# Patient Record
Sex: Female | Born: 1975 | Race: White | Hispanic: No | Smoking: Current every day smoker
Health system: Southern US, Community
[De-identification: ages and names within clinical notes are randomized; demographics above are authoritative.]

## PROBLEM LIST (undated history)

## (undated) HISTORY — PX: MANDIBLE SURGERY: SHX707

## (undated) HISTORY — PX: TUBAL LIGATION: SHX77

---

## 1998-08-08 ENCOUNTER — Other Ambulatory Visit: Admission: RE | Admit: 1998-08-08 | Discharge: 1998-08-08 | Payer: Self-pay | Admitting: Obstetrics & Gynecology

## 1999-03-05 ENCOUNTER — Emergency Department (HOSPITAL_COMMUNITY): Admission: EM | Admit: 1999-03-05 | Discharge: 1999-03-06 | Payer: Self-pay | Admitting: Emergency Medicine

## 1999-03-06 ENCOUNTER — Encounter: Payer: Self-pay | Admitting: Emergency Medicine

## 1999-08-14 ENCOUNTER — Encounter: Payer: Self-pay | Admitting: Emergency Medicine

## 1999-08-14 ENCOUNTER — Emergency Department (HOSPITAL_COMMUNITY): Admission: EM | Admit: 1999-08-14 | Discharge: 1999-08-14 | Payer: Self-pay | Admitting: Emergency Medicine

## 2000-05-28 ENCOUNTER — Other Ambulatory Visit: Admission: RE | Admit: 2000-05-28 | Discharge: 2000-05-28 | Payer: Self-pay | Admitting: Obstetrics and Gynecology

## 2000-05-31 ENCOUNTER — Inpatient Hospital Stay (HOSPITAL_COMMUNITY): Admission: AD | Admit: 2000-05-31 | Discharge: 2000-05-31 | Payer: Self-pay | Admitting: Obstetrics and Gynecology

## 2000-05-31 ENCOUNTER — Encounter: Payer: Self-pay | Admitting: Obstetrics and Gynecology

## 2000-08-27 ENCOUNTER — Encounter: Payer: Self-pay | Admitting: Obstetrics and Gynecology

## 2000-08-27 ENCOUNTER — Ambulatory Visit (HOSPITAL_COMMUNITY): Admission: RE | Admit: 2000-08-27 | Discharge: 2000-08-27 | Payer: Self-pay | Admitting: Obstetrics and Gynecology

## 2000-11-26 ENCOUNTER — Inpatient Hospital Stay (HOSPITAL_COMMUNITY): Admission: AD | Admit: 2000-11-26 | Discharge: 2000-11-26 | Payer: Self-pay | Admitting: *Deleted

## 2001-01-18 ENCOUNTER — Encounter (INDEPENDENT_AMBULATORY_CARE_PROVIDER_SITE_OTHER): Payer: Self-pay

## 2001-01-18 ENCOUNTER — Inpatient Hospital Stay (HOSPITAL_COMMUNITY): Admission: AD | Admit: 2001-01-18 | Discharge: 2001-01-20 | Payer: Self-pay | Admitting: Obstetrics and Gynecology

## 2001-03-09 ENCOUNTER — Other Ambulatory Visit: Admission: RE | Admit: 2001-03-09 | Discharge: 2001-03-09 | Payer: Self-pay | Admitting: Obstetrics and Gynecology

## 2001-03-21 ENCOUNTER — Emergency Department (HOSPITAL_COMMUNITY): Admission: EM | Admit: 2001-03-21 | Discharge: 2001-03-22 | Payer: Self-pay | Admitting: Emergency Medicine

## 2001-12-11 ENCOUNTER — Ambulatory Visit (HOSPITAL_COMMUNITY): Admission: RE | Admit: 2001-12-11 | Discharge: 2001-12-11 | Payer: Self-pay | Admitting: Obstetrics and Gynecology

## 2002-01-16 ENCOUNTER — Emergency Department (HOSPITAL_COMMUNITY): Admission: EM | Admit: 2002-01-16 | Discharge: 2002-01-16 | Payer: Self-pay | Admitting: Emergency Medicine

## 2002-02-15 ENCOUNTER — Emergency Department (HOSPITAL_COMMUNITY): Admission: EM | Admit: 2002-02-15 | Discharge: 2002-02-15 | Payer: Self-pay | Admitting: Emergency Medicine

## 2002-02-15 ENCOUNTER — Encounter: Payer: Self-pay | Admitting: Emergency Medicine

## 2002-03-28 ENCOUNTER — Emergency Department (HOSPITAL_COMMUNITY): Admission: EM | Admit: 2002-03-28 | Discharge: 2002-03-28 | Payer: Self-pay | Admitting: Emergency Medicine

## 2002-03-28 ENCOUNTER — Encounter: Payer: Self-pay | Admitting: Emergency Medicine

## 2002-04-20 ENCOUNTER — Emergency Department (HOSPITAL_COMMUNITY): Admission: EM | Admit: 2002-04-20 | Discharge: 2002-04-20 | Payer: Self-pay | Admitting: Emergency Medicine

## 2002-06-12 ENCOUNTER — Inpatient Hospital Stay (HOSPITAL_COMMUNITY): Admission: AD | Admit: 2002-06-12 | Discharge: 2002-06-17 | Payer: Self-pay | Admitting: Oral Surgery

## 2002-06-13 ENCOUNTER — Encounter: Payer: Self-pay | Admitting: Oral Surgery

## 2002-08-16 ENCOUNTER — Emergency Department (HOSPITAL_COMMUNITY): Admission: EM | Admit: 2002-08-16 | Discharge: 2002-08-16 | Payer: Self-pay | Admitting: Emergency Medicine

## 2002-12-28 ENCOUNTER — Other Ambulatory Visit: Admission: RE | Admit: 2002-12-28 | Discharge: 2002-12-28 | Payer: Self-pay | Admitting: Obstetrics and Gynecology

## 2003-07-24 ENCOUNTER — Encounter: Payer: Self-pay | Admitting: Orthodontics and Dentofacial Orthopedics

## 2003-07-24 ENCOUNTER — Ambulatory Visit (HOSPITAL_COMMUNITY)
Admission: RE | Admit: 2003-07-24 | Discharge: 2003-07-24 | Payer: Self-pay | Admitting: Orthodontics and Dentofacial Orthopedics

## 2003-09-03 ENCOUNTER — Encounter: Payer: Self-pay | Admitting: Emergency Medicine

## 2003-09-03 ENCOUNTER — Emergency Department (HOSPITAL_COMMUNITY): Admission: EM | Admit: 2003-09-03 | Discharge: 2003-09-03 | Payer: Self-pay | Admitting: Emergency Medicine

## 2005-09-26 ENCOUNTER — Emergency Department (HOSPITAL_COMMUNITY): Admission: EM | Admit: 2005-09-26 | Discharge: 2005-09-26 | Payer: Self-pay | Admitting: Emergency Medicine

## 2007-08-02 ENCOUNTER — Emergency Department (HOSPITAL_COMMUNITY): Admission: EM | Admit: 2007-08-02 | Discharge: 2007-08-02 | Payer: Self-pay | Admitting: Family Medicine

## 2007-08-27 ENCOUNTER — Emergency Department (HOSPITAL_COMMUNITY): Admission: EM | Admit: 2007-08-27 | Discharge: 2007-08-27 | Payer: Self-pay | Admitting: Emergency Medicine

## 2009-08-11 ENCOUNTER — Encounter: Admission: RE | Admit: 2009-08-11 | Discharge: 2009-08-11 | Payer: Self-pay | Admitting: Infectious Diseases

## 2010-09-01 ENCOUNTER — Emergency Department (HOSPITAL_COMMUNITY): Admission: EM | Admit: 2010-09-01 | Discharge: 2010-09-01 | Payer: Self-pay | Admitting: Emergency Medicine

## 2011-01-23 LAB — PROTIME-INR: Prothrombin Time: 13.1 seconds (ref 11.6–15.2)

## 2011-01-23 LAB — CBC
HCT: 41.8 % (ref 36.0–46.0)
MCHC: 34 g/dL (ref 30.0–36.0)
Platelets: 230 10*3/uL (ref 150–400)
RDW: 13.6 % (ref 11.5–15.5)

## 2011-01-23 LAB — COMPREHENSIVE METABOLIC PANEL
Albumin: 3.9 g/dL (ref 3.5–5.2)
BUN: 9 mg/dL (ref 6–23)
Calcium: 9.2 mg/dL (ref 8.4–10.5)
Chloride: 105 mEq/L (ref 96–112)
Creatinine, Ser: 0.71 mg/dL (ref 0.4–1.2)
GFR calc non Af Amer: >60 mL/min (ref >60)
Total Bilirubin: 0.5 mg/dL (ref 0.3–1.2)

## 2011-01-23 LAB — DIFFERENTIAL
Basophils Absolute: 0 10*3/uL (ref 0.0–0.1)
Basophils Relative: 1 % (ref 0–1)
Monocytes Absolute: 0.8 10*3/uL (ref 0.1–1.0)
Neutro Abs: 4.6 10*3/uL (ref 1.7–7.7)
Neutrophils Relative %: 56 % (ref 43–77)

## 2011-01-23 LAB — APTT: aPTT: 33 seconds (ref 24–37)

## 2011-03-29 NOTE — Discharge Summary (Signed)
[facility]  Patient:    Meagan Ramos, Meagan Ramos                     MRN: [id number] Adm. Date:  [id number] Disc. Date: [id number] Attending:  Malon Kindle                           Discharge Summary                                A 35 year old white female para 1-0-1-1, gravida 3.  Last period April 20, 2000.  [facility] February 04, 2001 by dates and January 24, 2001 by 18 week ultrasound.  Admitted in active labor.  Blood group and type O+ with a negative antibody.  VDRL nonreactive.  Rubella positive.  Hepatitis B surface antigen negative.  HIV nonreactive.  GC and chlamydia negative. Triple screen normal.  One hour glucola was not available but the patient reports group B strep was positive.  Patient had first trimester bleeding and smoked.  She was advised to quit smoking.  On January 09, 2001 the cervix was 2 cm, 50%.  She began contracting on the day of admission and came here for evaluation.  Otherwise, her prenatal course was unremarkable.  ALLERGIES:                    PENICILLIN.  PAST MEDICAL HISTORY:         Usual childhood diseases.  PAST SURGICAL HISTORY:        Jaw surgery in 1994.  FAMILY HISTORY:               Mother with thyroid cancer.  Fathers health is unknown.  SOCIAL HISTORY:               Alcohol, tobacco, and drugs negative except for five cigarettes a day.  PAST OBSTETRICAL HISTORY:     In 1994 therapeutic abortion, August 1998 7 pound 10 ounce female vaginally, precipitous delivery by the nurse.  PHYSICAL EXAMINATION  VITAL SIGNS:                  Blood pressure 124/75, pulse 96, temperature 97.3.  ABDOMEN:                      Soft.  Fundal height is 40 cm on January 09, 2001. Fetal heart tones showed occasional decelerations with good accelerations, contractions q.4-5 minutes.  PELVIC:                       Cervix 3-4 cm, 70-80% effaced, vertex -2. Artificial rupture of membranes produced clear fluid.  ADMITTING IMPRESSION:          Intrauterine pregnancy at 39 weeks, positive group B strep.  The patient was placed on intravenous clindamycin.  By 6:50 p.m. the contractions were not terribly painful.  Cervix was 3-4 cm, 80-90%, vertex at a -2.  Cervix was still somewhat posterior.  Pitocin was begun.  The patient progressed to full dilatation and delivered spontaneously LOA over a second degree perineal laceration by Dr. Ambrose Mantle.  A living female infant 8 pounds 9 ounces Apgars of 9 at one and 9 at five minutes.  The placenta was intact.  Uterus was normal.  Rectal examination was negative.  Second degree perineal laceration repaired with 3-0  Dexon under local block.  Blood loss about 400 cc.  Postpartum the patient did well.  She did complain of some pain in the right inguinal area on the first postpartum day.  This resolved.  She had no further complaints.  She was considered a candidate for discharge on the second postpartum day.  LABORATORIES:                 White count 15,300, hemoglobin 12.7, hematocrit 37.2, platelet count 262,000.  Followup hemoglobin 11.5, hematocrit 33.7.  RPR was nonreactive.  FINAL DIAGNOSES:              Intrauterine pregnancy 39 weeks, delivered left occiput anterior, positive group B strep.  OPERATION:                    Spontaneous delivery left occiput anterior, repair of second degree perineal laceration.  CONDITION ON DISCHARGE:       Improved.  DISCHARGE INSTRUCTIONS:       Regular discharge instruction booklet.  Patient is advised to return to the office in six weeks for followup examination.  She is given a prescription for 16 Percocet 325/325 tablets to take q.4-6h. p.r.n. pain. DD:  01/20/01 TD:  01/20/01 Job: [id number]

## 2011-03-29 NOTE — Op Note (Signed)
**Note De-Identified Meagan Ramos Obfuscation** NAME:  Meagan Ramos, Meagan Ramos                        ACCOUNT NO.:  [id number]   MEDICAL RECORD NO.:  [id number]                   PATIENT TYPE:  INP   LOCATION:  5743                                 FACILITY:  [facility]   PHYSICIAN:  Georgia Lopes, M.D.               DATE OF BIRTH:  Jun 16, 1976   DATE OF PROCEDURE:  06/15/2002  DATE OF DISCHARGE:  06/17/2002                                 OPERATIVE REPORT   PREOPERATIVE DIAGNOSIS:  Left temporomandibular joint closed lock.   POSTOPERATIVE DIAGNOSIS:  Left temporomandibular joint closed lock.   PROCEDURE:  Left temporomandibular joint arthrotomy with meniscal plication  and arthroplasty.   SURGEON:  Georgia Lopes, M.D.   ANESTHESIA:  General, Burna Forts, M.D., attending.   ESTIMATED BLOOD LOSS:  Minimum.   COMPLICATIONS:  None.   SPECIMENS:  Aerobic and anaerobic cultures, left TMJ.   DESCRIPTION OF PROCEDURE:  The patient was taken to the operating room and  placed on the operating table in the supine position.  General anesthesia  was administered intravenously and a nasoendotracheal tube was placed and  secured.  The eyes were lubricated and protected.  Then the patient was  prepped and draped.  A sterile marking pen was used to demarcate the  endaural incision line.  Then 2% lidocaine and 1:100,000 epinephrine was  infiltrated x7 cc and Marcaine 0.5% and 1:200,000 epinephrine was  infiltrated, a total of 4 cc.  Then a #15 blade was used to make the  endaural incision.  The dissection was carried down through the skin and  into the subcutaneous tissue using a hemostat.  Then the superior aspect of  the incision was dissected first down to the deep layer of temporal fascia.  This was used as a plane of dissection and a hemostat was used to tunnel  underneath the deep tissue and exit through the various points of the  incision and to elevate the tissue down to this level.  Bovie electrocautery  and bipolar was used.  The  dissection was carried down anteriorly at this  plane to the zygomatic arch, then a horizontal incision was made over the  zygomatic arch.  Dissection was carried down over the joint space, and then  the joint was incised with a #15 blade.  When the joint was incised, a  purulent-appearing exudate was expressed.  Aerobic anaerobic cultures were  taken of this material.  Then a clamp was placed on the inferior border of  the mandible to aid in retraction of the jaw and the joint was examined.  It  appeared that the disc was in normal configuration in closed mouth.  However, when the mouth was opened the disc did displace anteriorly.  There  was a large TMJ articular eminence present, and a decision was made  therefore to trim this eminence to help with disc translation.  The rasp was  used in  the handpiece under irrigation to smooth the articular eminence.  This was the arthroplasty.  Then the disc was freed anteriorly with the  [facility] elevator and a 4-0 Mersilene was used to make a plicating suture in  the retrodisc tissues x3 sutures.  The wound was then irrigated copiously  and then the capsule was closed with 3-0  Mersilene, deep tissues were closed with 3-0 Mersilene and 3-0 chromic, and  the skin was closed with 5-0 nylon and 5-0 Monocryl.  A bacitracin dressing  was placed, then Kerlix and fluffs and an Ace bandage were applied.  The  patient was awakened, extubated, taken to the recovery room breathing  spontaneously, in good condition.                                               Georgia Lopes, M.D.    SMJ/MEDQ  D:  06/15/2002  T:  06/18/2002  Job:  [id number]

## 2011-03-29 NOTE — H&P (Signed)
NAME:  Meagan Ramos, Meagan Ramos                        ACCOUNT NO.:  [id number]   MEDICAL RECORD NO.:  [id number]                   PATIENT TYPE:  INP   LOCATION:  5743                                 FACILITY:  [facility]   PHYSICIAN:  Georgia Lopes, M.D.               DATE OF BIRTH:  1976-11-03   DATE OF ADMISSION:  06/12/2002  DATE OF DISCHARGE:  06/17/2002                                HISTORY & PHYSICAL   HISTORY OF PRESENT ILLNESS:  Hania is a 34 year old white female who is  admitted for left TMJ jaw joint dislocation.  She was a patient in my office  who was seen on 07/31 for arthrocentesis and steroid injection of the left  TMJ.  Using intravenous sedation the procedure went very well.  She  presented the following day to the office with excruciating pain and was  placed on p.o. Demerol and given intra-articular injections of Marcaine, and  a steroid Dosepak.  She called on 06/12/02 complaining of severe pain  unrelieved by the Demerol and felt that her jaw had become dislocated or was  out of joint again.  So therefore she was admitted to the hospital for  intravenous pain management and intravenous steroids.   ALLERGIES:  None.   PAST MEDICAL HISTORY:  Benign.   MEDICATIONS:  Demerol 50 mg p.r.n., prednisone Dosepak, and Flexeril 10 mg  h.s.   PAST SURGICAL HISTORY:  Orthognathic surgery bilateral status post split  osteotomy approximately 7 years ago.  Arthrocentesis on 06/10/02 of the left  TMJ.   SOCIAL HISTORY:  The patient smokes a half pack a day for 10 years.   PHYSICAL EXAMINATION:  Well-nourished, well-developed 35 year old white  female in no acute distress.  Vital signs:  Temp 100.9, heart rate 102,  respirations 20, BP 119/75.  O2 sat 97.  Skin was without rashes or lesions.  Head:  Normocephalic atraumatic.  Eyes:  Pupils equal, round, reactive to  light and accommodation. Extraocular motions intact.  Tympanic membranes  normal right an left.  Septum pink and  midline.  Oral maximum opening 15 mm,  jaw deviated to the left.  Unable to close teeth into proper occlusion.  Pharynx clear.  The remainder of the oral cavity appeared within normal  limits.  Neck was supple, no JVD.  Heart:  Regular rate and rhythm.  Lungs:  Clear.  Abdomen soft, nontender.  Positive bowel sounds.  Extremities:  Without cyanosis, clubbing, or edema.  Letter streaked ankle times 4.  Neurological:  Cranial nerves were intact.   IMPRESSION:  A 35 year old white female with left TMJ jaw dislocation.   PLAN:  Intravenous steroids, intravenous pain management with PCA morphine.  Obtain an MRI and Panorex radiographs to confirm jaw dislocation.  Depending  upon the severity will treat with either conservative therapy,  intramaxillary fixation, or open joint surgery.  Georgia Lopes, M.D.    SMJ/MEDQ  D:  06/13/2002  T:  06/18/2002  Job:  [id number]

## 2011-03-29 NOTE — Discharge Summary (Signed)
   NAME:  Meagan Ramos, Meagan Ramos                        ACCOUNT NO.:  [id number]   MEDICAL RECORD NO.:  [id number]                   PATIENT TYPE:  INP   LOCATION:  5743                                 FACILITY:  [facility]   PHYSICIAN:  Georgia Lopes, M.D.               DATE OF BIRTH:  26-Jul-1976   DATE OF ADMISSION:  06/12/2002  DATE OF DISCHARGE:  06/17/2002                                 DISCHARGE SUMMARY   ADMISSION DIAGNOSIS:  Left temporomandibular joint jaw dislocation.   DISCHARGE DIAGNOSIS:  Left temporomandibular joint jaw dislocation.   HOSPITAL COURSE:  This is a 35 year old female who was admitted for left TMJ  jaw dislocation. She had been seen in my office three or for days prior to  her admission for steroid injection of the left TMJ.  She began to  experience excruciating pain subsequent to the surgery and was unable to  open her mouth wide.  Therefore, she was admitted for IV hydration after  become dehydrated and for pain management as well as steroid intravenous  medications for swelling and diminished joint inflammation and edema.   She remained on intravenous fluids, pain medicine, and steroids throughout  her hospital stay.  She underwent open joint surgery on the left side on  06/15/2002 to correct her TMJ closed locked.  At that time, the disk was found  to be anteriorly displaced, and an arthroplasty was performed.   Postoperatively, she began to feel better and was discharged on 06/17/2002.   PROCEDURE:  Left TMJ arthrotomy on 06/15/2002.   DISCHARGE MEDICATIONS:  The patient was discharged by telephone and reported  to my office to receive medications for pain which was:  1. Percocet 5/325 x 40 tablets to be taken 1 to 2 q.4-6h. p.r.n. pain.  2. Cl eosin 150 mg 1 q.6h. For 7 days.  3. Medrol Dosepak.   DISCHARGE INSTRUCTIONS:  The patient was instructed to remain on a soft  diet, to use moist heat to the left TMJ, to keep her sutures dry until her  postoperative  visit, and her postoperative visit was scheduled for one week  after surgery.   CONDITION ON DISCHARGE:  Much improved, able to tolerate p.o.'s, able to  open her mouth, decreased swelling, and improving function of the left TMJ.                                               Georgia Lopes, M.D.    SMJ/MEDQ  D:  06/26/2002  T:  06/29/2002  Job:  [id number]

## 2011-03-29 NOTE — Op Note (Signed)
[facility]  Patient:    Meagan Ramos, Meagan Ramos Visit Number: [id number] MRN: [id number]          Service Type: DSU Location: DAY Attending Physician:  Malon Kindle Dictated by:   Malachi Pro. Ambrose Mantle, M.D. Proc. Date: 12/11/01 Admit Date:  12/11/2001                             Operative Report  PREOPERATIVE DIAGNOSIS:  Voluntary sterilization.  POSTOPERATIVE DIAGNOSIS:  Voluntary sterilization.  OPERATION:  Laparoscopic tubal cauterization.  OPERATOR:  Malachi Pro. Ambrose Mantle, M.D.  ANESTHESIA:  General.  DESCRIPTION OF PROCEDURE:  The patient was brought to the operating room and placed under satisfactory general anesthesia and was placed in the [place] stirrups.  The abdomen, vulva, vagina, and urethra were prepped with Betadine solution.  The uterus was examined and found to be anterior and normal size. A Hulka cannula was placed into the uterus; however, the angle from the shaft of the cannula to the tip of the cannula was too much toward 90 degrees.  I inserted this into the uterus and attached it to the anterior cervical lip.  I then emptied the bladder with a [place] catheter, draped the abdomen as a sterile field, and inserted a Veress cannula through a small subumbilical semilunar incision into the peritoneal cavity.  I aspirated to make sure I was not in a blood vessel, not in bowel or urinary contents, and I was in a negative pressure space.  So then I injected three liters of CO2 into the peritoneal cavity, placed the trocar into the peritoneal cavity, and then the laparoscope with good visualization.  I then established a little more pneumoperitoneum and inserted a smaller trocar suprapubically under direct vision and then manipulated the uterus, tubes, and ovaries into good operating position.  There was a normal posterior cul-de-sac, normal uterus, normal anterior cul-de-sac, normal right tube and ovary and normal left tube and ovary.   The upper abdomen was explored and found to be normal.  The gallbladder looked smooth.  The liver looked smooth without abnormalities.  I did not see any upper abdominal abnormalities.  I then cauterized each tube in three or four consecutive locations with a bipolar current of 4 until the musical statement from the machine indicated that the vapor was zero.  Midway through the procedure, the Hulka cannula came out, and it was replaced with a sponge stick.  This allowed better manipulation of the uterus, and the procedure was completed this way.  I did notice a small area on the left anterior fundus of the uterus that appeared maybe it had been injured slightly by the Hulka cannula, but it was not bleeding.  I pulled out the lower abdominal trocar.  That site was not bleeding.  I released the pneumoperitoneum and then reinspected the uterus without pressure, and there was no bleeding from the site previously mentioned.  I then withdrew the laparoscope slowly through the umbilical incision, and there was no bleeding there.  I closed the umbilical incision with interrupted 3-0 plain catgut and the lower incision with Steri-Strips.  The patient seemed to tolerate the procedure well.  I did reinspect the vagina to make sure that the Hulka cannula had not done any injury there.  The procedure was terminated.  The patient was returned to recovery in satisfactory condition. Dictated by:   Malachi Pro. Ambrose Mantle, M.D. Attending Physician:  Malon Kindle DD:  12/11/01 TD:  12/12/01 Job: [id number]

## 2011-05-26 ENCOUNTER — Emergency Department (HOSPITAL_COMMUNITY)
Admission: EM | Admit: 2011-05-26 | Discharge: 2011-05-26 | Disposition: A | Discharge status: Home / Self Care | Payer: Medicaid Other | Attending: Emergency Medicine | Admitting: Emergency Medicine

## 2011-05-26 ENCOUNTER — Emergency Department (HOSPITAL_COMMUNITY): Payer: Medicaid Other

## 2011-05-26 DIAGNOSIS — M79609 Pain in unspecified limb: Secondary | ICD-10-CM | POA: Insufficient documentation

## 2011-05-26 DIAGNOSIS — S60229A Contusion of unspecified hand, initial encounter: Secondary | ICD-10-CM | POA: Insufficient documentation

## 2011-05-26 DIAGNOSIS — M7989 Other specified soft tissue disorders: Secondary | ICD-10-CM | POA: Insufficient documentation

## 2011-05-26 DIAGNOSIS — IMO0002 Reserved for concepts with insufficient information to code with codable children: Secondary | ICD-10-CM | POA: Insufficient documentation

## 2011-07-19 ENCOUNTER — Emergency Department (HOSPITAL_COMMUNITY)
Admission: EM | Admit: 2011-07-19 | Discharge: 2011-07-19 | Disposition: A | Discharge status: Home / Self Care | Payer: Medicaid Other | Attending: Emergency Medicine | Admitting: Emergency Medicine

## 2011-07-19 DIAGNOSIS — S81009A Unspecified open wound, unspecified knee, initial encounter: Secondary | ICD-10-CM | POA: Insufficient documentation

## 2011-07-19 DIAGNOSIS — L259 Unspecified contact dermatitis, unspecified cause: Secondary | ICD-10-CM | POA: Insufficient documentation

## 2011-07-19 DIAGNOSIS — R609 Edema, unspecified: Secondary | ICD-10-CM | POA: Insufficient documentation

## 2011-07-19 DIAGNOSIS — W540XXA Bitten by dog, initial encounter: Secondary | ICD-10-CM | POA: Insufficient documentation

## 2011-07-19 DIAGNOSIS — M129 Arthropathy, unspecified: Secondary | ICD-10-CM | POA: Insufficient documentation

## 2011-08-22 LAB — WET PREP, GENITAL: Yeast Wet Prep HPF POC: NONE SEEN

## 2011-08-22 LAB — GC/CHLAMYDIA PROBE AMP, GENITAL: Chlamydia, DNA Probe: NEGATIVE

## 2011-12-24 ENCOUNTER — Encounter (HOSPITAL_COMMUNITY): Payer: Self-pay | Admitting: Emergency Medicine

## 2011-12-24 ENCOUNTER — Emergency Department (INDEPENDENT_AMBULATORY_CARE_PROVIDER_SITE_OTHER)
Admission: EM | Admit: 2011-12-24 | Discharge: 2011-12-24 | Disposition: A | Discharge status: Home / Self Care | Payer: Medicaid Other | Source: Home / Self Care | Attending: Family Medicine | Admitting: Family Medicine

## 2011-12-24 DIAGNOSIS — G44209 Tension-type headache, unspecified, not intractable: Secondary | ICD-10-CM

## 2011-12-24 DIAGNOSIS — F4320 Adjustment disorder, unspecified: Secondary | ICD-10-CM

## 2011-12-24 NOTE — ED Provider Notes (Signed)
History     CSN: [id number]  Arrival date & time 12/24/11  1324   First MD Initiated Contact with Patient 12/24/11 1453      Chief Complaint  Patient presents with  . Hypertension    (Consider location/radiation/quality/duration/timing/severity/associated sxs/prior treatment) HPI Comments: Meagan Ramos presents for evaluation for frontal headache over the last few days. She reports, that she's been evaluated by 2 different providers over 2 days with reportedly elevated blood pressures. She first ordered a gynecologist yesterday with a reported blood pressure of 180/132. She was then evaluated by a dentist today with a pressure reading of 158/102. She reports onset of lots of stress recently with the departure of her husband, taking one of the 2 children. She is also not currently working and has no source of income. She denies any thoughts of harming herself or her child or anyone else. She denies any known history of blood pressure problems or heart disease. Family history significant for grandfather died at 40 of cancer. She denies any chest pain, discomfort, shortness of breath, nausea, vomiting, or visual changes. She has not taken anything for the headache.  Patient is a 36 y.o. female presenting with headaches.  Headache The primary symptoms include headaches. Primary symptoms do not include syncope, loss of consciousness, seizures, dizziness, paresthesias, focal weakness, nausea or vomiting. The symptoms began 3 to 5 days ago. The symptoms are unchanged.  The headache is associated with neck stiffness. The headache is not associated with aura, photophobia, paresthesias or weakness.  Additional symptoms include neck stiffness. Additional symptoms do not include weakness, photophobia, aura or tinnitus.    History reviewed. No pertinent past medical history.  Past Surgical History  Procedure Date  . Mandible surgery   . Tubal ligation     History reviewed. No pertinent family  history.  History  Substance Use Topics  . Smoking status: Current Everyday Smoker  . Smokeless tobacco: Not on file  . Alcohol Use: No    OB History    Grav Para Term Preterm Abortions TAB SAB Ect Mult Living                  Review of Systems  Constitutional: Negative.   HENT: Positive for neck stiffness. Negative for tinnitus.   Eyes: Negative.  Negative for photophobia.  Respiratory: Negative.   Cardiovascular: Negative.  Negative for syncope.  Gastrointestinal: Negative.  Negative for nausea and vomiting.  Genitourinary: Negative.   Skin: Negative.   Neurological: Positive for headaches. Negative for dizziness, focal weakness, seizures, loss of consciousness, weakness, light-headedness, numbness and paresthesias.  Psychiatric/Behavioral: Negative for suicidal ideas, sleep disturbance, self-injury and decreased concentration.    Allergies  Penicillins  Home Medications  No current outpatient prescriptions on file.  BP 142/91  Pulse 83  Temp(Src) 98.6 F (37 C) (Oral)  Resp 16  SpO2 100%  LMP 12/09/2011  Physical Exam  Nursing note and vitals reviewed. Constitutional: She is oriented to person, place, and time. She appears well-developed and well-nourished.  HENT:  Head: Normocephalic and atraumatic.  Eyes: Conjunctivae, EOM and lids are normal. Pupils are equal, round, and reactive to light.  Neck: Normal range of motion. Neck supple. Muscular tenderness present. No spinous process tenderness present.  Pulmonary/Chest: Effort normal.  Musculoskeletal: Normal range of motion.  Neurological: She is alert and oriented to person, place, and time. She has normal strength. No cranial nerve deficit or sensory deficit.  Skin: Skin is warm and dry.  Psychiatric: Her behavior is  normal.    ED Course  Procedures (including critical care time)  Labs Reviewed - No data to display No results found.   1. Tension headache   2. Adjustment disorder       MDM   Advised naproxen for pain relief; referred to PCP, given [facility] information and advised to present there for further evaluation; advised about Emergency Department for acute symptoms and crisis hotlines as needed        Richardo Priest, MD 12/24/11 1627

## 2011-12-24 NOTE — ED Notes (Signed)
Has been complaining of headache and feeling dizzy for a few days.  Patient reports gyn yesterday reported bp 180/132?Marland Kitchen  Today for the dentist blood pressure reading was 158/102.   Patient reports a lot of stress recently, over the past month has had spouse leave, taking one child, leaving one child and no income.  Patient reports symptoms of anxiety.

## 2011-12-24 NOTE — Discharge Instructions (Signed)
Your symptoms sound consistent with a tension type headache, possibly exacerbated by your recent life stressors. You may take one to two naproxen (Naprosyn, Aleve) tablets every 12 hours as needed for pain; you may take acetaminophen in between for added relief. Return to care should your symptoms not improve, or worsen in any way, such as increased pain, vision changes, shortness of breath, chest pain, numbness, tingling, or weakness. Please establish care with a primary care provider. You may call the [facility] referral service at the number provided and ask which providers accept Medicaid. Also, should you feel your emotional stress or symptoms are increasing, please present to the behavioral health Hospital at the address listed. Should you have any acute feelings of harming yourself or others, please present to the nearest emergency department.

## 2012-10-27 ENCOUNTER — Emergency Department (HOSPITAL_COMMUNITY): Payer: Self-pay

## 2012-10-27 ENCOUNTER — Encounter (HOSPITAL_COMMUNITY): Payer: Self-pay | Admitting: Emergency Medicine

## 2012-10-27 ENCOUNTER — Emergency Department (HOSPITAL_COMMUNITY)
Admission: EM | Admit: 2012-10-27 | Discharge: 2012-10-27 | Disposition: A | Discharge status: Home / Self Care | Payer: Self-pay | Attending: Emergency Medicine | Admitting: Emergency Medicine

## 2012-10-27 DIAGNOSIS — Y9389 Activity, other specified: Secondary | ICD-10-CM | POA: Insufficient documentation

## 2012-10-27 DIAGNOSIS — Z9851 Tubal ligation status: Secondary | ICD-10-CM | POA: Insufficient documentation

## 2012-10-27 DIAGNOSIS — F17200 Nicotine dependence, unspecified, uncomplicated: Secondary | ICD-10-CM | POA: Insufficient documentation

## 2012-10-27 DIAGNOSIS — Y92410 Unspecified street and highway as the place of occurrence of the external cause: Secondary | ICD-10-CM | POA: Insufficient documentation

## 2012-10-27 DIAGNOSIS — Z9889 Other specified postprocedural states: Secondary | ICD-10-CM | POA: Insufficient documentation

## 2012-10-27 DIAGNOSIS — IMO0002 Reserved for concepts with insufficient information to code with codable children: Secondary | ICD-10-CM | POA: Insufficient documentation

## 2012-10-27 LAB — CBC
HCT: 42.2 % (ref 36.0–46.0)
MCHC: 33.9 g/dL (ref 30.0–36.0)
MCV: 88.7 fL (ref 78.0–100.0)
RDW: 13.3 % (ref 11.5–15.5)

## 2012-10-27 LAB — BASIC METABOLIC PANEL
BUN: 6 mg/dL (ref 6–23)
Creatinine, Ser: 0.62 mg/dL (ref 0.50–1.10)
GFR calc Af Amer: >90 mL/min (ref >90)
GFR calc non Af Amer: >90 mL/min (ref >90)

## 2012-10-27 MED ORDER — IOHEXOL 300 MG/ML  SOLN
100.0000 mL | Freq: Once | INTRAMUSCULAR | Status: AC | PRN
  Administered 2012-10-27: 100 mL via INTRAVENOUS

## 2012-10-27 MED ORDER — MORPHINE SULFATE 4 MG/ML IJ SOLN
8.0000 mg | Freq: Once | INTRAMUSCULAR | Status: AC
  Administered 2012-10-27: 8 mg via INTRAVENOUS
  Filled 2012-10-27: qty 2

## 2012-10-27 MED ORDER — OXYCODONE-ACETAMINOPHEN 5-325 MG PO TABS
1.0000 | ORAL_TABLET | Freq: Once | ORAL | Status: AC
  Administered 2012-10-27: 1 via ORAL
  Filled 2012-10-27: qty 1

## 2012-10-27 MED ORDER — OXYCODONE-ACETAMINOPHEN 5-325 MG PO TABS: 1.0000 | ORAL_TABLET | ORAL | Status: DC | PRN

## 2012-10-27 MED ORDER — KETOROLAC TROMETHAMINE 30 MG/ML IJ SOLN
30.0000 mg | Freq: Once | INTRAMUSCULAR | Status: AC
  Administered 2012-10-27: 30 mg via INTRAVENOUS
  Filled 2012-10-27: qty 1

## 2012-10-27 NOTE — ED Notes (Signed)
Pt has had tubal ligation in 2002

## 2012-10-27 NOTE — ED Notes (Signed)
Pt remains in Radiology
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
## 2012-10-27 NOTE — ED Notes (Signed)
Pt is undressed and in a gown
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
## 2012-10-27 NOTE — ED Notes (Signed)
Pt denies headache; pt denies feeling dizzy and nausea; pt has no visible deformities; pt mentating appropriately.

## 2012-10-27 NOTE — ED Notes (Signed)
Pt states she takes methadone at 0500 every morning and takes 85mg . Pt states she does not want pain medication if it something she will have to take for long periods of time.

## 2012-10-27 NOTE — ED Notes (Signed)
Pt returned from Radiology; placed back on monitor
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
## 2014-01-17 ENCOUNTER — Encounter (HOSPITAL_COMMUNITY): Payer: Self-pay | Admitting: Emergency Medicine

## 2014-01-17 ENCOUNTER — Emergency Department (INDEPENDENT_AMBULATORY_CARE_PROVIDER_SITE_OTHER)
Admission: EM | Admit: 2014-01-17 | Discharge: 2014-01-17 | Disposition: A | Discharge status: Home / Self Care | Payer: Self-pay | Source: Home / Self Care | Attending: Emergency Medicine | Admitting: Emergency Medicine

## 2014-01-17 DIAGNOSIS — S0500XA Injury of conjunctiva and corneal abrasion without foreign body, unspecified eye, initial encounter: Secondary | ICD-10-CM

## 2014-01-17 DIAGNOSIS — S058X9A Other injuries of unspecified eye and orbit, initial encounter: Secondary | ICD-10-CM

## 2014-01-17 MED ORDER — OXYCODONE-ACETAMINOPHEN 5-325 MG PO TABS: ORAL_TABLET | ORAL | Status: DC

## 2014-01-17 MED ORDER — TETRACAINE HCL 0.5 % OP SOLN
2.0000 [drp] | Freq: Once | OPHTHALMIC | Status: AC
  Administered 2014-01-17: 2 [drp] via OPHTHALMIC

## 2014-01-17 MED ORDER — POLYMYXIN B-TRIMETHOPRIM 10000-0.1 UNIT/ML-% OP SOLN: 1.0000 [drp] | OPHTHALMIC | Status: DC

## 2014-01-17 MED ORDER — TETRACAINE HCL 0.5 % OP SOLN
OPHTHALMIC | Status: AC
  Filled 2014-01-17: qty 2

## 2014-01-17 NOTE — ED Notes (Signed)
States she woke w eye pain, no known injury

## 2014-01-17 NOTE — ED Provider Notes (Signed)
Chief Complaint   Chief Complaint  Patient presents with  . Eye Pain    History of Present Illness   Meagan Ramos is a 38 year old female who has had a one-day history of left eye pain. She worked in the garden yesterday. She's not sure whether anything got in her eye. She did not have any eye pain yesterday. When she awoke this morning. She noted severe pain in the left eye. There is a foreign body sensation. She has some swelling of the upper lid. She noted some redness of the eyes and some yellow drainage. Her vision is blurry in that eye. She denies any fever, chills, headache, nasal congestion, rhinorrhea, sore throat, or adenopathy.  Review of Systems   Other than as noted above, the patient denies any of the following symptoms: Systemic:  No fever, chills, sweats, fatigue, or weight loss. Eye:  No redness, eye pain, photophobia, discharge, blurred vision, or diplopia. ENT:  No nasal congestion, rhinorrhea, or sore throat. Lymphatic:  No adenopathy. Skin:  No rash or pruritis.  PMFSH   Past medical history, family history, social history, meds, and allergies were reviewed.  She is allergic to penicillin.  Physical Examination    Vital signs:  BP 101/61  Pulse 87  Temp(Src) 98.5 F (36.9 C) (Oral)  Resp 16  SpO2 99%  Visual Acuity:  Right Eye Distance: 20/25 Left Eye Distance: 20/60 Bilateral Distance: 20/30  General:  Alert and in no distress. Eye:  Her upper lid was slightly swollen and red. It was mildly tender to palpation. The conjunctival sac was normal with no discharge, and the upper lid was everted and no foreign body was noted there. Her conjunctiva was not injected. Fluorescein staining of the cornea reveals a single, linear abrasion at the 6:00 position, just below the pupil. Otherwise the cornea was intact. Anterior chamber was normal. PERRLA, full EOMs, fundi were benign. ENT:  TMs and canals clear.  Nasal mucosa normal.  No intra-oral lesions, mucous  membranes moist, pharynx clear. Neck:  No adenopathy tenderness or mass. Skin:  Clear, warm and dry.  [facility]   A drop of tetracaine was instilled into her left eye with almost immediate relief of her symptoms.  Assessment   The encounter diagnosis was Corneal abrasion.  This is small and should heal quickly.  Plan     1.  Meds:  The following meds were prescribed:   Discharge Medication List as of 01/17/2014  1:39 PM    START taking these medications   Details  !! oxyCODONE-acetaminophen (PERCOCET) 5-325 MG per tablet 1 to 2 tablets every 6 hours as needed for pain., Print    trimethoprim-polymyxin b (POLYTRIM) ophthalmic solution Place 1 drop into the left eye every 4 (four) hours., Starting 01/17/2014, Until Discontinued, Normal     !! - Potential duplicate medications found. Please discuss with provider.      2.  Patient Education/Counseling:  The patient was given appropriate handouts, self care instructions, and instructed in symptomatic relief.  Patient advised not to rub the eye and to use sunglasses in bright sunlight. She may use hot or cold compresses.  3.  Follow up:  The patient was told to follow up here if no better in 48 hours, or sooner if becoming worse in any way, and given some red flag symptoms such as increasing pain or changes in vision which would prompt immediate return.  Follow up here as needed.  Reuben Likesavid C Aela Bohan, MD 01/17/14 1420

## 2014-01-17 NOTE — Discharge Instructions (Signed)
Corneal Abrasion  The cornea is the clear covering at the front and center of the eye. When looking at the colored portion of the eye (iris), you are looking through the cornea. This very thin tissue is made up of many layers. The surface layer is a single layer of cells (corneal epithelium) and is one of the most sensitive tissues in the body. If a scratch or injury causes the corneal epithelium to come off, it is called a corneal abrasion. If the injury extends to the tissues below the epithelium, the condition is called a corneal ulcer.  CAUSES    Scratches.   Trauma.   Foreign body in the eye.  Some people have recurrences of abrasions in the area of the original injury even after it has healed (recurrent erosion syndrome). Recurrent erosion syndrome generally improves and goes away with time.  SYMPTOMS    Eye pain.   Difficulty or inability to keep the injured eye open.   The eye becomes very sensitive to light.   Recurrent erosions tend to happen suddenly, first thing in the morning, usually after waking up and opening the eye.  DIAGNOSIS   Your health care provider can diagnose a corneal abrasion during an eye exam. Dye is usually placed in the eye using a drop or a small paper strip moistened by your tears. When the eye is examined with a special light, the abrasion shows up clearly because of the dye.  TREATMENT    Small abrasions may be treated with antibiotic drops or ointment alone.   Usually a pressure patch is specially applied. Pressure patches prevent the eye from blinking, allowing the corneal epithelium to heal. A pressure patch also reduces the amount of pain present in the eye during healing. Most corneal abrasions heal within 2 3 days with no effect on vision.  If the abrasion becomes infected and spreads to the deeper tissues of the cornea, a corneal ulcer can result. This is serious because it can cause corneal scarring. Corneal scars interfere with light passing through the cornea  and cause a loss of vision in the involved eye.  HOME CARE INSTRUCTIONS   Use medicine or ointment as directed. Only take over-the-counter or prescription medicines for pain, discomfort, or fever as directed by your health care provider.   Do not drive or operate machinery while your eye is patched. Your ability to judge distances is impaired.   If your health care provider has given you a follow-up appointment, it is very important to keep that appointment. Not keeping the appointment could result in a severe eye infection or permanent loss of vision. If there is any problem keeping the appointment, let your health care provider know.  SEEK MEDICAL CARE IF:    You have pain, light sensitivity, and a scratchy feeling in one eye or both eyes.   Your pressure patch keeps loosening up, and you can blink your eye under the patch after treatment.   Any kind of discharge develops from the eye after treatment or if the lids stick together in the morning.   You have the same symptoms in the morning as you did with the original abrasion days, weeks, or months after the abrasion healed.  MAKE SURE YOU:    Understand these instructions.   Will watch your condition.   Will get help right away if you are not doing well or get worse.  Document Released: 10/25/2000 Document Revised: 08/18/2013 Document Reviewed: 07/05/2013  [employer] Patient Information   2014 [employer].

## 2014-11-09 ENCOUNTER — Encounter (HOSPITAL_COMMUNITY): Payer: Self-pay | Admitting: *Deleted

## 2014-11-09 ENCOUNTER — Emergency Department (HOSPITAL_COMMUNITY)
Admission: EM | Admit: 2014-11-09 | Discharge: 2014-11-09 | Disposition: A | Discharge status: Home / Self Care | Payer: Medicaid Other | Attending: Emergency Medicine | Admitting: Emergency Medicine

## 2014-11-09 DIAGNOSIS — Z720 Tobacco use: Secondary | ICD-10-CM | POA: Insufficient documentation

## 2014-11-09 DIAGNOSIS — Z880 Allergy status to penicillin: Secondary | ICD-10-CM | POA: Insufficient documentation

## 2014-11-09 DIAGNOSIS — H00016 Hordeolum externum left eye, unspecified eyelid: Secondary | ICD-10-CM | POA: Diagnosis not present

## 2014-11-09 DIAGNOSIS — M25552 Pain in left hip: Secondary | ICD-10-CM | POA: Insufficient documentation

## 2014-11-09 DIAGNOSIS — Z79899 Other long term (current) drug therapy: Secondary | ICD-10-CM | POA: Insufficient documentation

## 2014-11-09 DIAGNOSIS — G8929 Other chronic pain: Secondary | ICD-10-CM

## 2014-11-09 MED ORDER — IBUPROFEN 800 MG PO TABS
800.0000 mg | ORAL_TABLET | Freq: Three times a day (TID) | ORAL | Status: DC
Start: 2014-11-09 — End: 2015-01-19

## 2014-11-09 NOTE — ED Provider Notes (Signed)
CSN: [id number]     Arrival date & time 11/09/14  0917 History   This chart was scribed for Fayrene HelperBowie Amena Dockham, PA-C, working with Suzi RootsKevin E Steinl, MD by Chestine SporeSoijett Blue, ED Scribe. The patient was seen in room TR04C/TR04C at 9:50 AM.     Chief Complaint  Patient presents with  . Eye Problem  . Hip Pain    The history is provided by the patient. No language interpreter was used.    HPI Comments: Meagan Ramos is a 38 y.o. female who presents to the Emergency Department complaining of left eye problem onset this morning PTA. She reports that she woke up this morning and it felt like there was sand or something in her eye. She feels something in her eye when she blinks. When she got to work her boss informed her to leave because it looked like she had pink eye. She has associated symptoms of blurred vision, dry eye. She denies any other symptoms. Denies contacts lenses.     Left hip pain that she has dealt with for 17 years. The pain was not as bad until she began to work again with two jobs. She reports that she dislocated her hip 17 years ago during pregnancy. Her hip pain feels like it is dislocating and it bothers her daily. When she lays on the hip it feels slightly better. Her pain is worsened when there is movement and walking. The pain doesn't radiate. She has tried 600 mg Ibuprofen for her pain with no relief for her symptoms.  She denies numbness, rash, back pain, knee or ankle pain, and any other symptoms.   History reviewed. No pertinent past medical history. Past Surgical History  Procedure Laterality Date  . Mandible surgery    . Tubal ligation     History reviewed. No pertinent family history. History  Substance Use Topics  . Smoking status: Current Every Day Smoker  . Smokeless tobacco: Not on file  . Alcohol Use: No   OB History    No data available     Review of Systems  Eyes: Positive for redness and visual disturbance.  Musculoskeletal: Positive for myalgias and  arthralgias. Negative for back pain.  Skin: Negative for rash.  Neurological: Negative for numbness.      Allergies  Penicillins  Home Medications   Prior to Admission medications   Medication Sig Start Date End Date Taking? Authorizing Provider  clonazePAM (KLONOPIN) 1 MG tablet Take 1 mg by mouth 2 (two) times daily as needed. For anxiety    Historical Provider, MD  ibuprofen (ADVIL,MOTRIN) 800 MG tablet Take 1 tablet (800 mg total) by mouth 3 (three) times daily. 11/09/14   Fayrene HelperBowie Jemuel Laursen, PA-C  METHADONE HCL PO Take 85 mg by mouth every morning.    Historical Provider, MD  oxyCODONE-acetaminophen (PERCOCET) 5-325 MG per tablet Take 1 tablet by mouth every 4 (four) hours as needed for pain. 10/27/12   Elwin MochaBlair Walden, MD  oxyCODONE-acetaminophen (PERCOCET) 5-325 MG per tablet 1 to 2 tablets every 6 hours as needed for pain. 01/17/14   Reuben Likesavid C Keller, MD  trimethoprim-polymyxin b (POLYTRIM) ophthalmic solution Place 1 drop into the left eye every 4 (four) hours. 01/17/14   Reuben Likesavid C Keller, MD   BP 111/75 mmHg  Pulse 96  Temp(Src) 97.8 F (36.6 C) (Oral)  Resp 18  SpO2 98%  Physical Exam  Constitutional: She is oriented to person, place, and time. She appears well-developed and well-nourished. No distress.  HENT:  Head: Normocephalic and atraumatic.  Eyes: Conjunctivae and EOM are normal. Pupils are equal, round, and reactive to light. Lids are everted and swept, no foreign bodies found. Left eye exhibits hordeolum (noted to left upper eyelid).  Left eye everted with no FB noted. Hordeolum noted to upper eyelid that is TTP.  Visual Acuity - Bilateral Distance: 20/30 ; R Distance: 20/30 ; L Distance: 20/50  Neck: Neck supple. No tracheal deviation present.  Cardiovascular: Normal rate.   Pulmonary/Chest: Effort normal. No respiratory distress.  Musculoskeletal: Normal range of motion.       Left hip: She exhibits tenderness. She exhibits no deformity.  L para lumbar spinal muscle without  any spinal tenderness. Tenderness to L anterior iliac crest with palpation but no deformity and no overlaying skin changes. Normal ROM of the Left hip.  Neurological: She is alert and oriented to person, place, and time.  Skin: Skin is warm and dry.  Psychiatric: She has a normal mood and affect. Her behavior is normal.  Nursing note and vitals reviewed.   ED Course  Procedures (including critical care time) DIAGNOSTIC STUDIES: Oxygen Saturation is 98% on room air, normal by my interpretation.    COORDINATION OF CARE: 9:59 AM-patient with evidence of a stye left upper eyelid. Recommend warm compress as treatment. Discussed treatment plan which includes stretch, gentle massage, warm compresses, 800 mg Ibuprofen, and referral to orthopedic with pt at bedside and pt agreed to plan.   Labs Review Labs Reviewed - No data to display  Imaging Review No results found.   EKG Interpretation None      MDM   Final diagnoses:  Chronic hip pain, left  Sty, external, left    BP 111/75 mmHg  Pulse 96  Temp(Src) 97.8 F (36.6 C) (Oral)  Resp 18  SpO2 98%   I personally performed the services described in this documentation, which was scribed in my presence. The recorded information has been reviewed and is accurate.    Fayrene HelperBowie Tameshia Bonneville, PA-C 11/09/14 1608  Suzi RootsKevin E Steinl, MD 11/10/14 [phone]

## 2014-11-09 NOTE — ED Notes (Signed)
Pt reports hx of left hip injury and having increase in pain to hip recently. Also woke up with redness and irritation to left eye. Ambulatory at triage.

## 2014-11-09 NOTE — Discharge Instructions (Signed)
Arthralgia °Your caregiver has diagnosed you as suffering from an arthralgia. Arthralgia means there is pain in a joint. This can come from many reasons including: °· Bruising the joint which causes soreness (inflammation) in the joint. °· Wear and tear on the joints which occur as we grow older (osteoarthritis). °· Overusing the joint. °· Various forms of arthritis. °· Infections of the joint. °Regardless of the cause of pain in your joint, most of these different pains respond to anti-inflammatory drugs and rest. The exception to this is when a joint is infected, and these cases are treated with antibiotics, if it is a bacterial infection. °HOME CARE INSTRUCTIONS  °· Rest the injured area for as long as directed by your caregiver. Then slowly start using the joint as directed by your caregiver and as the pain allows. Crutches as directed may be useful if the ankles, knees or hips are involved. If the knee was splinted or casted, continue use and care as directed. If an stretchy or elastic wrapping bandage has been applied today, it should be removed and re-applied every 3 to 4 hours. It should not be applied tightly, but firmly enough to keep swelling down. Watch toes and feet for swelling, bluish discoloration, coldness, numbness or excessive pain. If any of these problems (symptoms) occur, remove the ace bandage and re-apply more loosely. If these symptoms persist, contact your caregiver or return to this location. °· For the first 24 hours, keep the injured extremity elevated on pillows while lying down. °· Apply ice for 15-20 minutes to the sore joint every couple hours while awake for the first half day. Then 03-04 times per day for the first 48 hours. Put the ice in a plastic bag and place a towel between the bag of ice and your skin. °· Wear any splinting, casting, elastic bandage applications, or slings as instructed. °· Only take over-the-counter or prescription medicines for pain, discomfort, or fever as  directed by your caregiver. Do not use aspirin immediately after the injury unless instructed by your physician. Aspirin can cause increased bleeding and bruising of the tissues. °· If you were given crutches, continue to use them as instructed and do not resume weight bearing on the sore joint until instructed. °Persistent pain and inability to use the sore joint as directed for more than 2 to 3 days are warning signs indicating that you should see a caregiver for a follow-up visit as soon as possible. Initially, a hairline fracture (break in bone) may not be evident on X-rays. Persistent pain and swelling indicate that further evaluation, non-weight bearing or use of the joint (use of crutches or slings as instructed), or further X-rays are indicated. X-rays may sometimes not show a small fracture until a week or 10 days later. Make a follow-up appointment with your own caregiver or one to whom we have referred you. A radiologist (specialist in reading X-rays) may read your X-rays. Make sure you know how you are to obtain your X-ray results. Do not assume everything is normal if you do not hear from us. °SEEK MEDICAL CARE IF: °Bruising, swelling, or pain increases. °SEEK IMMEDIATE MEDICAL CARE IF:  °· Your fingers or toes are numb or blue. °· The pain is not responding to medications and continues to stay the same or get worse. °· The pain in your joint becomes severe. °· You develop a fever over 102° F (38.9° C). °· It becomes impossible to move or use the joint. °MAKE SURE YOU:  °·   Understand these instructions.  Will watch your condition.  Will get help right away if you are not doing well or get worse. Document Released: 10/28/2005 Document Revised: 01/20/2012 Document Reviewed: 06/15/2008 [facility] Patient Information 2015 [place], [place]. This information is not intended to replace advice given to you by your health care provider. Make sure you discuss any questions you have with your health care  provider.   Sty A sty (hordeolum) is an infection of a gland in the eyelid located at the base of the eyelash. A sty may develop a white or yellow head of pus. It can be puffy (swollen). Usually, the sty will burst and pus will come out on its own. They do not leave lumps in the eyelid once they drain. A sty is often confused with another form of cyst of the eyelid called a chalazion. Chalazions occur within the eyelid and not on the edge where the bases of the eyelashes are. They often are red, sore and then form firm lumps in the eyelid. CAUSES   Germs (bacteria).  Lasting (chronic) eyelid inflammation. SYMPTOMS   Tenderness, redness and swelling along the edge of the eyelid at the base of the eyelashes.  Sometimes, there is a white or yellow head of pus. It may or may not drain. DIAGNOSIS  An ophthalmologist will be able to distinguish between a sty and a chalazion and treat the condition appropriately.  TREATMENT   Styes are typically treated with warm packs (compresses) until drainage occurs.  In rare cases, medicines that kill germs (antibiotics) may be prescribed. These antibiotics may be in the form of drops, cream or pills.  If a hard lump has formed, it is generally necessary to do a small incision and remove the hardened contents of the cyst in a minor surgical procedure done in the office.  In suspicious cases, your caregiver may send the contents of the cyst to the lab to be certain that it is not a rare, but dangerous form of cancer of the glands of the eyelid. HOME CARE INSTRUCTIONS   Wash your hands often and dry them with a clean towel. Avoid touching your eyelid. This may spread the infection to other parts of the eye.  Apply heat to your eyelid for 10 to 20 minutes, several times a day, to ease pain and help to heal it faster.  Do not squeeze the sty. Allow it to drain on its own. Wash your eyelid carefully 3 to 4 times per day to remove any pus. SEEK IMMEDIATE  MEDICAL CARE IF:   Your eye becomes painful or puffy (swollen).  Your vision changes.  Your sty does not drain by itself within 3 days.  Your sty comes back within a short period of time, even with treatment.  You have redness (inflammation) around the eye.  You have a fever. Document Released: 08/07/2005 Document Revised: 01/20/2012 Document Reviewed: 02/11/2014 [facility] Patient Information 2015 [place], [place]. This information is not intended to replace advice given to you by your health care provider. Make sure you discuss any questions you have with your health care provider.

## 2014-12-16 ENCOUNTER — Encounter: Payer: Self-pay | Admitting: Internal Medicine

## 2014-12-16 ENCOUNTER — Ambulatory Visit: Payer: Medicaid Other | Attending: Internal Medicine | Admitting: Internal Medicine

## 2014-12-16 ENCOUNTER — Ambulatory Visit (HOSPITAL_COMMUNITY)
Admission: RE | Admit: 2014-12-16 | Discharge: 2014-12-16 | Disposition: A | Discharge status: Home / Self Care | Payer: Medicaid Other | Source: Ambulatory Visit | Attending: Internal Medicine | Admitting: Internal Medicine

## 2014-12-16 VITALS — BP 132/83 | HR 86 | Temp 97.7°F | Resp 16 | Wt 131.2 lb

## 2014-12-16 DIAGNOSIS — F411 Generalized anxiety disorder: Secondary | ICD-10-CM | POA: Diagnosis not present

## 2014-12-16 DIAGNOSIS — Z139 Encounter for screening, unspecified: Secondary | ICD-10-CM

## 2014-12-16 DIAGNOSIS — Z23 Encounter for immunization: Secondary | ICD-10-CM | POA: Insufficient documentation

## 2014-12-16 DIAGNOSIS — R053 Chronic cough: Secondary | ICD-10-CM | POA: Insufficient documentation

## 2014-12-16 DIAGNOSIS — F1111 Opioid abuse, in remission: Secondary | ICD-10-CM | POA: Insufficient documentation

## 2014-12-16 DIAGNOSIS — M25552 Pain in left hip: Secondary | ICD-10-CM | POA: Diagnosis not present

## 2014-12-16 DIAGNOSIS — Z720 Tobacco use: Secondary | ICD-10-CM | POA: Insufficient documentation

## 2014-12-16 DIAGNOSIS — Z79891 Long term (current) use of opiate analgesic: Secondary | ICD-10-CM | POA: Insufficient documentation

## 2014-12-16 DIAGNOSIS — F1120 Opioid dependence, uncomplicated: Secondary | ICD-10-CM | POA: Insufficient documentation

## 2014-12-16 DIAGNOSIS — G8929 Other chronic pain: Secondary | ICD-10-CM | POA: Insufficient documentation

## 2014-12-16 DIAGNOSIS — Z791 Long term (current) use of non-steroidal anti-inflammatories (NSAID): Secondary | ICD-10-CM | POA: Insufficient documentation

## 2014-12-16 DIAGNOSIS — F1110 Opioid abuse, uncomplicated: Secondary | ICD-10-CM | POA: Insufficient documentation

## 2014-12-16 DIAGNOSIS — R05 Cough: Secondary | ICD-10-CM | POA: Insufficient documentation

## 2014-12-16 DIAGNOSIS — N920 Excessive and frequent menstruation with regular cycle: Secondary | ICD-10-CM | POA: Diagnosis not present

## 2014-12-16 DIAGNOSIS — F17210 Nicotine dependence, cigarettes, uncomplicated: Secondary | ICD-10-CM | POA: Insufficient documentation

## 2014-12-16 DIAGNOSIS — Z8349 Family history of other endocrine, nutritional and metabolic diseases: Secondary | ICD-10-CM | POA: Insufficient documentation

## 2014-12-16 DIAGNOSIS — R059 Cough, unspecified: Secondary | ICD-10-CM | POA: Insufficient documentation

## 2014-12-16 DIAGNOSIS — N921 Excessive and frequent menstruation with irregular cycle: Secondary | ICD-10-CM

## 2014-12-16 LAB — COMPLETE METABOLIC PANEL WITH GFR
ALT: 9 U/L (ref 0–35)
AST: 14 U/L (ref 0–37)
Albumin: 3.8 g/dL (ref 3.5–5.2)
Alkaline Phosphatase: 76 U/L (ref 39–117)
BILIRUBIN TOTAL: 0.3 mg/dL (ref 0.2–1.2)
BUN: 10 mg/dL (ref 6–23)
CHLORIDE: 104 meq/L (ref 96–112)
CO2: 26 meq/L (ref 19–32)
CREATININE: 0.52 mg/dL (ref 0.50–1.10)
Calcium: 9 mg/dL (ref 8.4–10.5)
GFR, Est Non African American: >89 mL/min
GLUCOSE: 90 mg/dL (ref 70–99)
POTASSIUM: 4.8 meq/L (ref 3.5–5.3)
Sodium: 139 mEq/L (ref 135–145)
TOTAL PROTEIN: 6.3 g/dL (ref 6.0–8.3)

## 2014-12-16 LAB — CBC WITH DIFFERENTIAL/PLATELET
Basophils Absolute: 0.1 10*3/uL (ref 0.0–0.1)
Basophils Relative: 1 % (ref 0–1)
EOS ABS: 0.2 10*3/uL (ref 0.0–0.7)
Eosinophils Relative: 3 % (ref 0–5)
HCT: 43.5 % (ref 36.0–46.0)
Hemoglobin: 14.4 g/dL (ref 12.0–15.0)
LYMPHS ABS: 2.6 10*3/uL (ref 0.7–4.0)
Lymphocytes Relative: 33 % (ref 12–46)
MCH: 29.6 pg (ref 26.0–34.0)
MCHC: 33.1 g/dL (ref 30.0–36.0)
MCV: 89.3 fL (ref 78.0–100.0)
MONOS PCT: 7 % (ref 3–12)
MPV: 10.2 fL (ref 8.6–12.4)
Monocytes Absolute: 0.6 10*3/uL (ref 0.1–1.0)
NEUTROS ABS: 4.4 10*3/uL (ref 1.7–7.7)
Neutrophils Relative %: 56 % (ref 43–77)
Platelets: 228 10*3/uL (ref 150–400)
RBC: 4.87 MIL/uL (ref 3.87–5.11)
RDW: 14.5 % (ref 11.5–15.5)
WBC: 7.9 10*3/uL (ref 4.0–10.5)

## 2014-12-16 LAB — LIPID PANEL
Cholesterol: 169 mg/dL (ref 0–200)
HDL: 52 mg/dL (ref >39)
LDL CALC: 107 mg/dL — AB (ref 0–99)
Total CHOL/HDL Ratio: 3.3 Ratio
Triglycerides: 51 mg/dL (ref <150)
VLDL: 10 mg/dL (ref 0–40)

## 2014-12-16 MED ORDER — BUPROPION HCL ER (SR) 150 MG PO TB12: 150.0000 mg | ORAL_TABLET | Freq: Two times a day (BID) | ORAL | Status: DC

## 2014-12-16 NOTE — Progress Notes (Signed)
Patient here to establish care Has arthritis in her hip Patient suffers from anxiety Has been having problems with her periods-spotting in between Concerned about her 100 pound weight loss in the past five months Has a chronic cough-sometimes coughs up dark tarry mucous

## 2014-12-16 NOTE — Patient Instructions (Signed)
Smoking Cessation Quitting smoking is important to your health and has many advantages. However, it is not always easy to quit since nicotine is a very addictive drug. Oftentimes, people try 3 times or more before being able to quit. This document explains the best ways for you to prepare to quit smoking. Quitting takes hard work and a lot of effort, but you can do it. ADVANTAGES OF QUITTING SMOKING  You will live longer, feel better, and live better.  Your body will feel the impact of quitting smoking almost immediately.  Within 20 minutes, blood pressure decreases. Your pulse returns to its normal level.  After 8 hours, carbon monoxide levels in the blood return to normal. Your oxygen level increases.  After 24 hours, the chance of having a heart attack starts to decrease. Your breath, hair, and body stop smelling like smoke.  After 48 hours, damaged nerve endings begin to recover. Your sense of taste and smell improve.  After 72 hours, the body is virtually free of nicotine. Your bronchial tubes relax and breathing becomes easier.  After 2 to 12 weeks, lungs can hold more air. Exercise becomes easier and circulation improves.  The risk of having a heart attack, stroke, cancer, or lung disease is greatly reduced.  After 1 year, the risk of coronary heart disease is cut in half.  After 5 years, the risk of stroke falls to the same as a nonsmoker.  After 10 years, the risk of lung cancer is cut in half and the risk of other cancers decreases significantly.  After 15 years, the risk of coronary heart disease drops, usually to the level of a nonsmoker.  If you are pregnant, quitting smoking will improve your chances of having a healthy baby.  The people you live with, especially any children, will be healthier.  You will have extra money to spend on things other than cigarettes. QUESTIONS TO THINK ABOUT BEFORE ATTEMPTING TO QUIT You may want to talk about your answers with your  health care provider.  Why do you want to quit?  If you tried to quit in the past, what helped and what did not?  What will be the most difficult situations for you after you quit? How will you plan to handle them?  Who can help you through the tough times? Your family? Friends? A health care provider?  What pleasures do you get from smoking? What ways can you still get pleasure if you quit? Here are some questions to ask your health care provider:  How can you help me to be successful at quitting?  What medicine do you think would be best for me and how should I take it?  What should I do if I need more help?  What is smoking withdrawal like? How can I get information on withdrawal? GET READY  Set a quit date.  Change your environment by getting rid of all cigarettes, ashtrays, matches, and lighters in your home, car, or work. Do not let people smoke in your home.  Review your past attempts to quit. Think about what worked and what did not. GET SUPPORT AND ENCOURAGEMENT You have a better chance of being successful if you have help. You can get support in many ways.  Tell your family, friends, and coworkers that you are going to quit and need their support. Ask them not to smoke around you.  Get individual, group, or telephone counseling and support. Programs are available at local hospitals and health centers. Call   your local health department for information about programs in your area.  Spiritual beliefs and practices may help some smokers quit.  Download a "quit meter" on your computer to keep track of quit statistics, such as how long you have gone without smoking, cigarettes not smoked, and money saved.  Get a self-help book about quitting smoking and staying off tobacco. LEARN NEW SKILLS AND BEHAVIORS  Distract yourself from urges to smoke. Talk to someone, go for a walk, or occupy your time with a task.  Change your normal routine. Take a different route to work.  Drink tea instead of coffee. Eat breakfast in a different place.  Reduce your stress. Take a hot bath, exercise, or read a book.  Plan something enjoyable to do every day. Reward yourself for not smoking.  Explore interactive web-based programs that specialize in helping you quit. GET MEDICINE AND USE IT CORRECTLY Medicines can help you stop smoking and decrease the urge to smoke. Combining medicine with the above behavioral methods and support can greatly increase your chances of successfully quitting smoking.  Nicotine replacement therapy helps deliver nicotine to your body without the negative effects and risks of smoking. Nicotine replacement therapy includes nicotine gum, lozenges, inhalers, nasal sprays, and skin patches. Some may be available over-the-counter and others require a prescription.  Antidepressant medicine helps people abstain from smoking, but how this works is unknown. This medicine is available by prescription.  Nicotinic receptor partial agonist medicine simulates the effect of nicotine in your brain. This medicine is available by prescription. Ask your health care provider for advice about which medicines to use and how to use them based on your health history. Your health care provider will tell you what side effects to look out for if you choose to be on a medicine or therapy. Carefully read the information on the package. Do not use any other product containing nicotine while using a nicotine replacement product.  RELAPSE OR DIFFICULT SITUATIONS Most relapses occur within the first 3 months after quitting. Do not be discouraged if you start smoking again. Remember, most people try several times before finally quitting. You may have symptoms of withdrawal because your body is used to nicotine. You may crave cigarettes, be irritable, feel very hungry, cough often, get headaches, or have difficulty concentrating. The withdrawal symptoms are only temporary. They are strongest  when you first quit, but they will go away within 10-14 days. To reduce the chances of relapse, try to:  Avoid drinking alcohol. Drinking lowers your chances of successfully quitting.  Reduce the amount of caffeine you consume. Once you quit smoking, the amount of caffeine in your body increases and can give you symptoms, such as a rapid heartbeat, sweating, and anxiety.  Avoid smokers because they can make you want to smoke.  Do not let weight gain distract you. Many smokers will gain weight when they quit, usually less than 10 pounds. Eat a healthy diet and stay active. You can always lose the weight gained after you quit.  Find ways to improve your mood other than smoking. FOR MORE INFORMATION  www.smokefree.gov  Document Released: 10/22/2001 Document Revised: 03/14/2014 Document Reviewed: 02/06/2012 [employer] Patient Information 2015 [employer]. This information is not intended to replace advice given to you by your health care provider. Make sure you discuss any questions you have with your health care provider.  

## 2014-12-16 NOTE — Progress Notes (Signed)
Patient Demographics  Meagan Brazenshley Falconi, is a 39 y.o. female  [id number]  [id number]  DOB - [birth year]  CC:  Chief Complaint  Patient presents with  . Establish Care       HPI: Meagan Ramos is a 39 y.o. female here today to establish medical care.patient has history of opioid dependence, currently on Suboxone, she also reported to have chronic left hip pain, she denies any recent fall or trauma, she does report chronic cough but does smoke cigarettes, I have counseled patient to quit smoking currently denies any fever chills chest pain or shortness of breath, she also history of anxiety/depression, patient is willing to try Wellbutrin which can help her to quit smoking as well as help her with her depression symptoms.patient has also reported to weight loss and irregular menstrual periods, she does have family history of thyroid disease. Patient has No headache, No chest pain, No abdominal pain - No Nausea, No new weakness tingling or numbness, No Cough - SOB.  Allergies  Allergen Reactions  . Penicillins Shortness Of Breath and Rash   History reviewed. No pertinent past medical history. Current Outpatient Prescriptions on File Prior to Visit  Medication Sig Dispense Refill  . clonazePAM (KLONOPIN) 1 MG tablet Take 1 mg by mouth 2 (two) times daily as needed. For anxiety    . ibuprofen (ADVIL,MOTRIN) 800 MG tablet Take 1 tablet (800 mg total) by mouth 3 (three) times daily. 21 tablet 0  . METHADONE HCL PO Take 85 mg by mouth every morning.    Marland Kitchen. oxyCODONE-acetaminophen (PERCOCET) 5-325 MG per tablet Take 1 tablet by mouth every 4 (four) hours as needed for pain. 10 tablet 0  . oxyCODONE-acetaminophen (PERCOCET) 5-325 MG per tablet 1 to 2 tablets every 6 hours as needed for pain. 20 tablet 0  . trimethoprim-polymyxin b (POLYTRIM) ophthalmic solution Place 1 drop into the left eye every 4 (four) hours. 10 mL 0   No current facility-administered medications on file prior to  visit.   Family History  Problem Relation Age of Onset  . Cancer Mother     thyroid cancer   . Hypertension Mother   . Cancer Father     lung cancer   . Hypertension Maternal Grandmother   . Cancer Maternal Grandfather     lung cancer    History   Social History  . Marital Status: Married    Spouse Name: N/A    Number of Children: N/A  . Years of Education: N/A   Occupational History  . Not on file.   Social History Main Topics  . Smoking status: Current Every Day Smoker -- 0.50 packs/day for 23 years  . Smokeless tobacco: Not on file  . Alcohol Use: No  . Drug Use: Not on file  . Sexual Activity: Yes    Birth Control/ Protection: Surgical   Other Topics Concern  . Not on file   Social History Narrative    Review of Systems: Constitutional: Negative for fever, chills, diaphoresis, activity change, appetite change and fatigue. HENT: Negative for ear pain, nosebleeds, congestion, facial swelling, rhinorrhea, neck pain, neck stiffness and ear discharge.  Eyes: Negative for pain, discharge, redness, itching and visual disturbance. Respiratory: Negative for cough, choking, chest tightness, shortness of breath, wheezing and stridor.  Cardiovascular: Negative for chest pain, palpitations and leg swelling. Gastrointestinal: Negative for abdominal distention. Genitourinary: Negative for dysuria, urgency, frequency, hematuria, flank pain, decreased urine volume, difficulty urinating and dyspareunia.  Musculoskeletal: Negative for  back pain, joint swelling, arthralgia and gait problem. Neurological: Negative for dizziness, tremors, seizures, syncope, facial asymmetry, speech difficulty, weakness, light-headedness, numbness and headaches.  Hematological: Negative for adenopathy. Does not bruise/bleed easily. Psychiatric/Behavioral: Negative for hallucinations, behavioral problems, confusion, dysphoric mood, decreased concentration and agitation.    Objective:   Filed Vitals:    12/16/14 0951  BP: 132/83  Pulse: 86  Temp: 97.7 F (36.5 C)  Resp: 16    Physical Exam: Constitutional: Patient appears well-developed and well-nourished. No distress. HENT: Normocephalic, atraumatic, External right and left ear normal. Oropharynx is clear and moist.  Eyes: Conjunctivae and EOM are normal. PERRLA, no scleral icterus. Neck: Normal ROM. Neck supple. No JVD. No tracheal deviation. No thyromegaly. CVS: RRR, S1/S2 +, no murmurs, no gallops, no carotid bruit.  Pulmonary: Effort and breath sounds normal, no stridor, rhonchi, wheezes, rales.  Abdominal: Soft. BS +, no distension, tenderness, rebound or guarding.  Musculoskeletal: Normal range of motion. No edema and no tenderness.  Neuro: Alert. Normal reflexes, muscle tone coordination. No cranial nerve deficit. Skin: Skin is warm and dry. No rash noted. Not diaphoretic. No erythema. No pallor. Psychiatric: Normal mood and affect. Behavior, judgment, thought content normal.  Lab Results  Component Value Date   WBC 10.5 10/27/2012   HGB 14.3 10/27/2012   HCT 42.2 10/27/2012   MCV 88.7 10/27/2012   PLT 243 10/27/2012   Lab Results  Component Value Date   CREATININE 0.62 10/27/2012   BUN 6 10/27/2012   NA 139 10/27/2012   K 3.6 10/27/2012   CL 102 10/27/2012   CO2 26 10/27/2012    No results found for: HGBA1C Lipid Panel  No results found for: CHOL, TRIG, HDL, CHOLHDL, VLDL, LDLCALC     Assessment and plan:   1. Chronic cough Likely secondary to chronic smoking. Have ordered chest x-ray. - DG Chest 2 View; Future  3. Needs flu shot Flu shot given today  4. Chronic left hip pain  - DG Arthro Hip Left; Future  5. History of opioid abuse Currently patient is on Suboxone.  6. Need for prophylactic vaccination against Streptococcus pneumoniae (pneumococcus) Pneumovax given today.  7. Menorrhagia with irregular cycle Will check CBC/TSH level.  8. Tobacco abuse/2. Generalized anxiety disorder  -  buPROPion (WELLBUTRIN SR) 150 MG 12 hr tablet; Take 1 tablet (150 mg total) by mouth 2 (two) times daily.  Dispense: 30 tablet; Refill: 3  9. Screening Ordered baseline blood work. - CBC with Differential/Platelet - COMPLETE METABOLIC PANEL WITH GFR - Lipid panel - Vit D  25 hydroxy (rtn osteoporosis monitoring) - Hemoglobin A1c  10. Family history of thyroid disease  - TSH      Health Maintenance  -Pap Smear: will be scheduled  -Vaccinations:  Flu shot and pneumovax today   Return in about 3 months (around 03/16/2015) for anxiety , Schedule Appt with Dr Glendora Score for PAP.     Doris Cheadle, MD

## 2014-12-17 LAB — VITAMIN D 25 HYDROXY (VIT D DEFICIENCY, FRACTURES): Vit D, 25-Hydroxy: 27 ng/mL — ABNORMAL LOW (ref 30–100)

## 2014-12-17 LAB — HEMOGLOBIN A1C
HEMOGLOBIN A1C: 5.5 % (ref <5.7)
Mean Plasma Glucose: 111 mg/dL (ref <117)

## 2014-12-17 LAB — TSH: TSH: 0.967 u[IU]/mL (ref 0.350–4.500)

## 2014-12-20 ENCOUNTER — Telehealth: Payer: Self-pay

## 2014-12-20 NOTE — Telephone Encounter (Signed)
-----   Message from Doris Cheadleeepak Advani, MD sent at 12/19/2014 11:30 AM EST ----- Call and let the Patient know that blood work is normal except for borderline low vitamin D, advise patient to take over-the-counter vitamin D 2000 units daily.

## 2014-12-20 NOTE — Telephone Encounter (Signed)
Patient is aware of her lab results and x ray results

## 2014-12-23 ENCOUNTER — Other Ambulatory Visit: Payer: Self-pay | Admitting: Family Medicine

## 2015-01-05 ENCOUNTER — Ambulatory Visit: Payer: Medicaid Other | Attending: Family Medicine | Admitting: Family Medicine

## 2015-01-05 ENCOUNTER — Encounter: Payer: Self-pay | Admitting: Family Medicine

## 2015-01-05 VITALS — BP 129/83 | HR 78 | Temp 98.0°F | Resp 16 | Wt 129.2 lb

## 2015-01-05 DIAGNOSIS — Z124 Encounter for screening for malignant neoplasm of cervix: Secondary | ICD-10-CM | POA: Diagnosis not present

## 2015-01-05 DIAGNOSIS — F17200 Nicotine dependence, unspecified, uncomplicated: Secondary | ICD-10-CM | POA: Insufficient documentation

## 2015-01-05 NOTE — Patient Instructions (Signed)
Ms. Wenda Overlandiddick,  Thank you for coming in today.   Pap done today. You will be called with results.  F/u with Dr. Orpah CobbAdvani for anxiety in 2 months   Dr. Armen PickupFunches

## 2015-01-05 NOTE — Progress Notes (Signed)
   Subjective:    Patient ID: Meagan Ramos, female    DOB: 03-22-1976, 39 y.o.   MRN: [id number] CC: here for pap  HPI 39 yo F here for screening pap. Having some spotting after periods. Sporadically. No recent bleeding. No vaginal discharge or pelvic pain.   Soc Hx: current smoker Review of Systems As per HPI    Objective:   Physical Exam BP 129/83 mmHg  Pulse 78  Temp(Src) 98 F (36.7 C)  Resp 16  Wt 129 lb 3.2 oz (58.605 kg)  SpO2 100% General appearance: alert, cooperative and no distress Pelvic: cervix normal in appearance, external genitalia normal, no adnexal masses or tenderness, no cervical motion tenderness, rectovaginal septum normal, uterus normal size, shape, and consistency and vagina normal without discharge       Assessment & Plan:

## 2015-01-05 NOTE — Assessment & Plan Note (Signed)
Screening pap done today  

## 2015-01-05 NOTE — Progress Notes (Signed)
Patient here for pap smear only

## 2015-01-09 ENCOUNTER — Telehealth: Payer: Self-pay | Admitting: *Deleted

## 2015-01-09 LAB — CERVICOVAGINAL ANCILLARY ONLY
CHLAMYDIA, DNA PROBE: NEGATIVE
Neisseria Gonorrhea: NEGATIVE
Wet Prep (BD Affirm): NEGATIVE
Wet Prep (BD Affirm): NEGATIVE
Wet Prep (BD Affirm): NEGATIVE

## 2015-01-09 NOTE — Telephone Encounter (Signed)
-----   Message from Lora PaulaJosalyn C Funches, MD sent at 01/09/2015  8:48 AM EST ----- Neg wet prep

## 2015-01-09 NOTE — Telephone Encounter (Signed)
-----   Message from Lora PaulaJosalyn C Funches, MD sent at 01/09/2015  3:48 PM EST ----- Neg Gc/chlam

## 2015-01-09 NOTE — Telephone Encounter (Signed)
Unable to LVM, voice mail full

## 2015-01-10 LAB — CYTOLOGY - PAP

## 2015-01-12 ENCOUNTER — Telehealth: Payer: Self-pay | Admitting: *Deleted

## 2015-01-12 NOTE — Telephone Encounter (Signed)
-----   Message from Lora PaulaJosalyn C Funches, MD sent at 01/11/2015  1:04 PM EST ----- Negative pap repeat in 5 years

## 2015-01-12 NOTE — Telephone Encounter (Signed)
Pt aware of resutls

## 2015-01-18 ENCOUNTER — Ambulatory Visit: Payer: Self-pay

## 2015-01-19 ENCOUNTER — Emergency Department (HOSPITAL_COMMUNITY)
Admission: EM | Admit: 2015-01-19 | Discharge: 2015-01-20 | Disposition: A | Discharge status: Home / Self Care | Payer: Medicaid Other | Attending: Emergency Medicine | Admitting: Emergency Medicine

## 2015-01-19 ENCOUNTER — Encounter (HOSPITAL_COMMUNITY): Payer: Self-pay

## 2015-01-19 ENCOUNTER — Emergency Department (HOSPITAL_COMMUNITY): Payer: Medicaid Other

## 2015-01-19 DIAGNOSIS — Z720 Tobacco use: Secondary | ICD-10-CM | POA: Diagnosis not present

## 2015-01-19 DIAGNOSIS — Z880 Allergy status to penicillin: Secondary | ICD-10-CM | POA: Diagnosis not present

## 2015-01-19 DIAGNOSIS — S63502A Unspecified sprain of left wrist, initial encounter: Secondary | ICD-10-CM | POA: Insufficient documentation

## 2015-01-19 DIAGNOSIS — M1990 Unspecified osteoarthritis, unspecified site: Secondary | ICD-10-CM | POA: Insufficient documentation

## 2015-01-19 DIAGNOSIS — Y92410 Unspecified street and highway as the place of occurrence of the external cause: Secondary | ICD-10-CM | POA: Diagnosis not present

## 2015-01-19 DIAGNOSIS — Z79899 Other long term (current) drug therapy: Secondary | ICD-10-CM | POA: Diagnosis not present

## 2015-01-19 DIAGNOSIS — S29011A Strain of muscle and tendon of front wall of thorax, initial encounter: Secondary | ICD-10-CM | POA: Insufficient documentation

## 2015-01-19 DIAGNOSIS — Y998 Other external cause status: Secondary | ICD-10-CM | POA: Diagnosis not present

## 2015-01-19 DIAGNOSIS — S0993XA Unspecified injury of face, initial encounter: Secondary | ICD-10-CM | POA: Insufficient documentation

## 2015-01-19 DIAGNOSIS — S6992XA Unspecified injury of left wrist, hand and finger(s), initial encounter: Secondary | ICD-10-CM | POA: Diagnosis present

## 2015-01-19 DIAGNOSIS — S161XXA Strain of muscle, fascia and tendon at neck level, initial encounter: Secondary | ICD-10-CM | POA: Insufficient documentation

## 2015-01-19 DIAGNOSIS — Z791 Long term (current) use of non-steroidal anti-inflammatories (NSAID): Secondary | ICD-10-CM | POA: Insufficient documentation

## 2015-01-19 DIAGNOSIS — Y9389 Activity, other specified: Secondary | ICD-10-CM | POA: Insufficient documentation

## 2015-01-19 DIAGNOSIS — S39012A Strain of muscle, fascia and tendon of lower back, initial encounter: Secondary | ICD-10-CM | POA: Insufficient documentation

## 2015-01-19 DIAGNOSIS — T148XXA Other injury of unspecified body region, initial encounter: Secondary | ICD-10-CM

## 2015-01-19 MED ORDER — CYCLOBENZAPRINE HCL 10 MG PO TABS: 10.0000 mg | ORAL_TABLET | Freq: Two times a day (BID) | ORAL | Status: DC | PRN

## 2015-01-19 MED ORDER — CYCLOBENZAPRINE HCL 10 MG PO TABS
10.0000 mg | ORAL_TABLET | Freq: Once | ORAL | Status: AC
  Administered 2015-01-19: 10 mg via ORAL
  Filled 2015-01-19: qty 1

## 2015-01-19 MED ORDER — IBUPROFEN 800 MG PO TABS: 800.0000 mg | ORAL_TABLET | Freq: Three times a day (TID) | ORAL | Status: DC

## 2015-01-19 MED ORDER — IBUPROFEN 800 MG PO TABS
800.0000 mg | ORAL_TABLET | Freq: Once | ORAL | Status: AC
  Administered 2015-01-19: 800 mg via ORAL
  Filled 2015-01-19: qty 1
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
## 2015-01-19 NOTE — ED Notes (Signed)
Pt took two aleve about 6pm
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
## 2015-07-01 ENCOUNTER — Emergency Department (HOSPITAL_COMMUNITY): Payer: Medicaid Other

## 2015-07-01 ENCOUNTER — Emergency Department (HOSPITAL_COMMUNITY)
Admission: EM | Admit: 2015-07-01 | Discharge: 2015-07-01 | Disposition: A | Discharge status: Home / Self Care | Payer: Medicaid Other | Attending: Emergency Medicine | Admitting: Emergency Medicine

## 2015-07-01 DIAGNOSIS — S50812A Abrasion of left forearm, initial encounter: Secondary | ICD-10-CM | POA: Insufficient documentation

## 2015-07-01 DIAGNOSIS — R000 Tachycardia, unspecified: Secondary | ICD-10-CM | POA: Diagnosis not present

## 2015-07-01 DIAGNOSIS — S70211A Abrasion, right hip, initial encounter: Secondary | ICD-10-CM | POA: Insufficient documentation

## 2015-07-01 DIAGNOSIS — Z880 Allergy status to penicillin: Secondary | ICD-10-CM | POA: Diagnosis not present

## 2015-07-01 DIAGNOSIS — Y9389 Activity, other specified: Secondary | ICD-10-CM | POA: Insufficient documentation

## 2015-07-01 DIAGNOSIS — S4991XA Unspecified injury of right shoulder and upper arm, initial encounter: Secondary | ICD-10-CM | POA: Diagnosis present

## 2015-07-01 DIAGNOSIS — F419 Anxiety disorder, unspecified: Secondary | ICD-10-CM | POA: Diagnosis not present

## 2015-07-01 DIAGNOSIS — S80212A Abrasion, left knee, initial encounter: Secondary | ICD-10-CM | POA: Insufficient documentation

## 2015-07-01 DIAGNOSIS — S43401A Unspecified sprain of right shoulder joint, initial encounter: Secondary | ICD-10-CM

## 2015-07-01 DIAGNOSIS — Z79891 Long term (current) use of opiate analgesic: Secondary | ICD-10-CM | POA: Insufficient documentation

## 2015-07-01 DIAGNOSIS — S0083XA Contusion of other part of head, initial encounter: Secondary | ICD-10-CM | POA: Diagnosis not present

## 2015-07-01 DIAGNOSIS — Y999 Unspecified external cause status: Secondary | ICD-10-CM | POA: Diagnosis not present

## 2015-07-01 DIAGNOSIS — S80211A Abrasion, right knee, initial encounter: Secondary | ICD-10-CM | POA: Insufficient documentation

## 2015-07-01 DIAGNOSIS — T22212A Burn of second degree of left forearm, initial encounter: Secondary | ICD-10-CM | POA: Insufficient documentation

## 2015-07-01 DIAGNOSIS — S0081XA Abrasion of other part of head, initial encounter: Secondary | ICD-10-CM | POA: Diagnosis not present

## 2015-07-01 DIAGNOSIS — Y92410 Unspecified street and highway as the place of occurrence of the external cause: Secondary | ICD-10-CM | POA: Diagnosis not present

## 2015-07-01 DIAGNOSIS — T07XXXA Unspecified multiple injuries, initial encounter: Secondary | ICD-10-CM

## 2015-07-01 DIAGNOSIS — S3991XA Unspecified injury of abdomen, initial encounter: Secondary | ICD-10-CM | POA: Diagnosis not present

## 2015-07-01 LAB — CBC
HCT: 42 % (ref 36.0–46.0)
Hemoglobin: 14.6 g/dL (ref 12.0–15.0)
MCH: 30.5 pg (ref 26.0–34.0)
MCHC: 34.8 g/dL (ref 30.0–36.0)
MCV: 87.7 fL (ref 78.0–100.0)
Platelets: 217 10*3/uL (ref 150–400)
RBC: 4.79 MIL/uL (ref 3.87–5.11)
RDW: 14.1 % (ref 11.5–15.5)
WBC: 9.9 10*3/uL (ref 4.0–10.5)

## 2015-07-01 LAB — COMPREHENSIVE METABOLIC PANEL
ALBUMIN: 3.8 g/dL (ref 3.5–5.0)
ALT: 29 U/L (ref 14–54)
ANION GAP: 13 (ref 5–15)
AST: 31 U/L (ref 15–41)
Alkaline Phosphatase: 64 U/L (ref 38–126)
BILIRUBIN TOTAL: 0.6 mg/dL (ref 0.3–1.2)
BUN: 9 mg/dL (ref 6–20)
CO2: 17 mmol/L — AB (ref 22–32)
Calcium: 9.3 mg/dL (ref 8.9–10.3)
Chloride: 107 mmol/L (ref 101–111)
Creatinine, Ser: 0.66 mg/dL (ref 0.44–1.00)
GFR calc Af Amer: >60 mL/min (ref >60)
GFR calc non Af Amer: >60 mL/min (ref >60)
GLUCOSE: 115 mg/dL — AB (ref 65–99)
POTASSIUM: 3.9 mmol/L (ref 3.5–5.1)
SODIUM: 137 mmol/L (ref 135–145)
Total Protein: 6.7 g/dL (ref 6.5–8.1)

## 2015-07-01 LAB — PROTIME-INR
INR: 1.03 (ref 0.00–1.49)
Prothrombin Time: 13.7 seconds (ref 11.6–15.2)

## 2015-07-01 LAB — ETHANOL

## 2015-07-01 LAB — HCG, SERUM, QUALITATIVE: Preg, Serum: NEGATIVE

## 2015-07-01 LAB — SAMPLE TO BLOOD BANK

## 2015-07-01 MED ORDER — SILVER SULFADIAZINE 1 % EX CREA
TOPICAL_CREAM | Freq: Once | CUTANEOUS | Status: AC
  Administered 2015-07-01: 16:00:00 via TOPICAL
  Filled 2015-07-01: qty 85

## 2015-07-01 MED ORDER — SODIUM CHLORIDE 0.9 % IV BOLUS (SEPSIS)
1000.0000 mL | Freq: Once | INTRAVENOUS | Status: AC
  Administered 2015-07-01: 1000 mL via INTRAVENOUS

## 2015-07-01 MED ORDER — TETANUS-DIPHTH-ACELL PERTUSSIS 5-2.5-18.5 LF-MCG/0.5 IM SUSP
INTRAMUSCULAR | Status: AC
  Filled 2015-07-01: qty 0.5

## 2015-07-01 MED ORDER — ONDANSETRON HCL 4 MG/2ML IJ SOLN
4.0000 mg | Freq: Once | INTRAMUSCULAR | Status: AC
  Administered 2015-07-01: 4 mg via INTRAVENOUS
  Filled 2015-07-01: qty 2

## 2015-07-01 MED ORDER — IOHEXOL 300 MG/ML  SOLN
100.0000 mL | Freq: Once | INTRAMUSCULAR | Status: AC | PRN
  Administered 2015-07-01: 100 mL via INTRAVENOUS

## 2015-07-01 MED ORDER — TETANUS-DIPHTH-ACELL PERTUSSIS 5-2.5-18.5 LF-MCG/0.5 IM SUSP
0.5000 mL | Freq: Once | INTRAMUSCULAR | Status: AC
  Administered 2015-07-01: 0.5 mL via INTRAMUSCULAR

## 2015-07-01 MED ORDER — HYDROMORPHONE HCL 1 MG/ML IJ SOLN
INTRAMUSCULAR | Status: AC
  Filled 2015-07-01: qty 1

## 2015-07-01 MED ORDER — METHOCARBAMOL 500 MG PO TABS: 500.0000 mg | ORAL_TABLET | Freq: Two times a day (BID) | ORAL | Status: DC

## 2015-07-01 MED ORDER — HYDROMORPHONE HCL 1 MG/ML IJ SOLN
1.0000 mg | Freq: Once | INTRAMUSCULAR | Status: AC
  Administered 2015-07-01: 1 mg via INTRAVENOUS

## 2015-07-01 MED ORDER — HYDROMORPHONE HCL 1 MG/ML IJ SOLN
1.0000 mg | Freq: Once | INTRAMUSCULAR | Status: AC
  Administered 2015-07-01: 1 mg via INTRAVENOUS
  Filled 2015-07-01: qty 1

## 2015-07-01 MED ORDER — NAPROXEN 500 MG PO TABS: 500.0000 mg | ORAL_TABLET | Freq: Two times a day (BID) | ORAL | Status: DC

## 2015-07-01 MED ORDER — LIDOCAINE HCL 2 % EX GEL
1.0000 "application " | Freq: Once | CUTANEOUS | Status: AC
  Administered 2015-07-01: 1 via TOPICAL
  Filled 2015-07-01: qty 20

## 2015-07-01 NOTE — ED Notes (Signed)
   Patient returned to room.  Dr. Karma Ganja at bedside, monitor reapplied.
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
## 2015-07-01 NOTE — ED Notes (Signed)
Calling lab about hcg

## 2015-07-01 NOTE — ED Notes (Signed)
Unable to clean and debride left posterior forearm due to severe pain.  Dr. Anitra Lauth advised, topical lidocaine ordered.
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
## 2015-07-01 NOTE — ED Notes (Addendum)
Pt rcvd 50 mcg Fentanyl pta pt hx of substance, pt currently taking Suboxene
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
## 2015-07-01 NOTE — ED Notes (Addendum)
Patient transported to x-ray. ?

## 2015-07-01 NOTE — Discharge Instructions (Signed)
Abrasions An abrasion is a cut or scrape of the skin. Abrasions do not go through all layers of the skin. HOME CARE  If a bandage (dressing) was put on your wound, change it as told by your doctor. If the bandage sticks, soak it off with warm.  Wash the area with water and soap 2 times a day. Rinse off the soap. Pat the area dry with a clean towel.  Put on medicated cream (ointment) as told by your doctor.  Change your bandage right away if it gets wet or dirty.  Only take medicine as told by your doctor.  See your doctor within 24-48 hours to get your wound checked.  Check your wound for redness, puffiness (swelling), or yellowish-white fluid (pus). GET HELP RIGHT AWAY IF:   You have more pain in the wound.  You have redness, swelling, or tenderness around the wound.  You have pus coming from the wound.  You have a fever or lasting symptoms for more than 2-3 days.  You have a fever and your symptoms suddenly get worse.  You have a bad smell coming from the wound or bandage. MAKE SURE YOU:   Understand these instructions.  Will watch your condition.  Will get help right away if you are not doing well or get worse. Document Released: 04/15/2008 Document Revised: 07/22/2012 Document Reviewed: 10/01/2011 Exit[employer] Patient Information 2015 [employer]. This information is not intended to replace advice given to you by your health care provider. Make sure you discuss any questions you have with your health care provider.  

## 2015-07-01 NOTE — ED Notes (Signed)
X-ray at bedside

## 2015-07-01 NOTE — ED Notes (Addendum)
Hooked pt back up to the monitor with a 5 lead, BP cuff and pulse ox

## 2015-07-01 NOTE — Progress Notes (Signed)
Orthopedic Tech Progress Note Patient Details:  Meagan Ramos September 19, 1976 [id number]  Ortho Devices Type of Ortho Device: ASO, Crutches Ortho Device/Splint Location: RLE Ortho Device/Splint Interventions: Ordered, Application, Adjustment   Meagan Ramos 07/01/2015, 5:30 PM

## 2015-07-01 NOTE — ED Notes (Signed)
Pt given ice pack

## 2015-09-25 ENCOUNTER — Telehealth: Payer: Self-pay

## 2015-09-25 ENCOUNTER — Other Ambulatory Visit: Payer: Self-pay

## 2015-09-25 DIAGNOSIS — Z720 Tobacco use: Secondary | ICD-10-CM

## 2015-09-25 MED ORDER — BUPROPION HCL ER (SR) 150 MG PO TB12: 150.0000 mg | ORAL_TABLET | Freq: Two times a day (BID) | ORAL | Status: DC

## 2015-09-25 NOTE — Telephone Encounter (Signed)
Nurse called patient, reached recording explaining "the number or code you have dialed is incorrect". Nurse called patient to make patient aware of need for appointment. Nurse received refill request for bupropion, nurse sent request to Kashawn Manzano for approval. Patient needs appointment to be seen. Nurse will send letter.

## 2017-05-24 DIAGNOSIS — F411 Generalized anxiety disorder: Secondary | ICD-10-CM | POA: Insufficient documentation

## 2017-05-24 DIAGNOSIS — F1120 Opioid dependence, uncomplicated: Secondary | ICD-10-CM | POA: Insufficient documentation

## 2018-03-25 ENCOUNTER — Other Ambulatory Visit: Payer: Self-pay | Admitting: Family Medicine

## 2018-03-25 DIAGNOSIS — Z1231 Encounter for screening mammogram for malignant neoplasm of breast: Secondary | ICD-10-CM

## 2018-06-09 DIAGNOSIS — F1190 Opioid use, unspecified, uncomplicated: Secondary | ICD-10-CM | POA: Insufficient documentation

## 2018-06-09 DIAGNOSIS — F330 Major depressive disorder, recurrent, mild: Secondary | ICD-10-CM | POA: Insufficient documentation

## 2018-12-21 DIAGNOSIS — E66811 Obesity, class 1: Secondary | ICD-10-CM | POA: Insufficient documentation

## 2019-03-24 DIAGNOSIS — F17210 Nicotine dependence, cigarettes, uncomplicated: Secondary | ICD-10-CM | POA: Insufficient documentation

## 2020-06-03 ENCOUNTER — Emergency Department (HOSPITAL_COMMUNITY): Payer: Self-pay

## 2020-06-03 ENCOUNTER — Encounter (HOSPITAL_COMMUNITY): Payer: Self-pay

## 2020-06-03 ENCOUNTER — Other Ambulatory Visit: Payer: Self-pay

## 2020-06-03 ENCOUNTER — Observation Stay (HOSPITAL_COMMUNITY)
Admission: EM | Admit: 2020-06-03 | Discharge: 2020-06-05 | Disposition: A | Discharge status: Home / Self Care | Payer: Self-pay | Attending: Physician Assistant | Admitting: Physician Assistant

## 2020-06-03 DIAGNOSIS — K810 Acute cholecystitis: Principal | ICD-10-CM | POA: Insufficient documentation

## 2020-06-03 DIAGNOSIS — F419 Anxiety disorder, unspecified: Secondary | ICD-10-CM | POA: Insufficient documentation

## 2020-06-03 DIAGNOSIS — Z20822 Contact with and (suspected) exposure to covid-19: Secondary | ICD-10-CM | POA: Insufficient documentation

## 2020-06-03 DIAGNOSIS — F17210 Nicotine dependence, cigarettes, uncomplicated: Secondary | ICD-10-CM | POA: Insufficient documentation

## 2020-06-03 DIAGNOSIS — K819 Cholecystitis, unspecified: Secondary | ICD-10-CM | POA: Diagnosis present

## 2020-06-03 DIAGNOSIS — R10811 Right upper quadrant abdominal tenderness: Secondary | ICD-10-CM

## 2020-06-03 LAB — I-STAT BETA HCG BLOOD, ED (MC, WL, AP ONLY): I-stat hCG, quantitative: <5 m[IU]/mL (ref <5)

## 2020-06-03 LAB — COMPREHENSIVE METABOLIC PANEL
ALT: 20 U/L (ref 0–44)
AST: 32 U/L (ref 15–41)
Albumin: 3.4 g/dL — ABNORMAL LOW (ref 3.5–5.0)
Alkaline Phosphatase: 87 U/L (ref 38–126)
Anion gap: 9 (ref 5–15)
BUN: 10 mg/dL (ref 6–20)
CO2: 24 mmol/L (ref 22–32)
Calcium: 8.8 mg/dL — ABNORMAL LOW (ref 8.9–10.3)
Chloride: 107 mmol/L (ref 98–111)
Creatinine, Ser: 0.79 mg/dL (ref 0.44–1.00)
GFR calc Af Amer: >60 mL/min (ref >60)
GFR calc non Af Amer: >60 mL/min (ref >60)
Glucose, Bld: 98 mg/dL (ref 70–99)
Potassium: 3.6 mmol/L (ref 3.5–5.1)
Sodium: 140 mmol/L (ref 135–145)
Total Bilirubin: 0.3 mg/dL (ref 0.3–1.2)
Total Protein: 6.3 g/dL — ABNORMAL LOW (ref 6.5–8.1)

## 2020-06-03 LAB — CBC WITH DIFFERENTIAL/PLATELET
Abs Immature Granulocytes: 0.02 10*3/uL (ref 0.00–0.07)
Basophils Absolute: 0.1 10*3/uL (ref 0.0–0.1)
Basophils Relative: 1 %
Eosinophils Absolute: 0.2 10*3/uL (ref 0.0–0.5)
Eosinophils Relative: 2 %
HCT: 42 % (ref 36.0–46.0)
Hemoglobin: 13.5 g/dL (ref 12.0–15.0)
Immature Granulocytes: 0 %
Lymphocytes Relative: 23 %
Lymphs Abs: 1.9 10*3/uL (ref 0.7–4.0)
MCH: 28.9 pg (ref 26.0–34.0)
MCHC: 32.1 g/dL (ref 30.0–36.0)
MCV: 89.9 fL (ref 80.0–100.0)
Monocytes Absolute: 0.7 10*3/uL (ref 0.1–1.0)
Monocytes Relative: 8 %
Neutro Abs: 5.3 10*3/uL (ref 1.7–7.7)
Neutrophils Relative %: 66 %
Platelets: 203 10*3/uL (ref 150–400)
RBC: 4.67 MIL/uL (ref 3.87–5.11)
RDW: 13.5 % (ref 11.5–15.5)
WBC: 8.1 10*3/uL (ref 4.0–10.5)
nRBC: 0 % (ref 0.0–0.2)

## 2020-06-03 LAB — LIPASE, BLOOD: Lipase: 20 U/L (ref 11–51)

## 2020-06-03 MED ORDER — ACETAMINOPHEN 650 MG RE SUPP: 650.0000 mg | Freq: Four times a day (QID) | RECTAL | Status: DC | PRN

## 2020-06-03 MED ORDER — MORPHINE SULFATE (PF) 4 MG/ML IV SOLN
4.0000 mg | Freq: Once | INTRAVENOUS | Status: AC
  Administered 2020-06-03: 4 mg via INTRAVENOUS
  Filled 2020-06-03: qty 1

## 2020-06-03 MED ORDER — PANTOPRAZOLE SODIUM 40 MG IV SOLR
40.0000 mg | Freq: Every day | INTRAVENOUS | Status: DC
  Administered 2020-06-04 (×2): 40 mg via INTRAVENOUS
  Filled 2020-06-03 (×2): qty 40

## 2020-06-03 MED ORDER — CLONAZEPAM 1 MG PO TABS
1.0000 mg | ORAL_TABLET | Freq: Two times a day (BID) | ORAL | Status: DC
  Administered 2020-06-04 – 2020-06-05 (×4): 1 mg via ORAL
  Filled 2020-06-03: qty 2
  Filled 2020-06-03 (×3): qty 1

## 2020-06-03 MED ORDER — MORPHINE SULFATE (PF) 4 MG/ML IV SOLN
4.0000 mg | INTRAVENOUS | Status: DC | PRN
  Administered 2020-06-04 (×2): 4 mg via INTRAVENOUS
  Filled 2020-06-03 (×2): qty 1

## 2020-06-03 MED ORDER — ONDANSETRON HCL 4 MG/2ML IJ SOLN: 4.0000 mg | Freq: Four times a day (QID) | INTRAMUSCULAR | Status: DC | PRN

## 2020-06-03 MED ORDER — ONDANSETRON 4 MG PO TBDP: 4.0000 mg | ORAL_TABLET | Freq: Four times a day (QID) | ORAL | Status: DC | PRN

## 2020-06-03 MED ORDER — SODIUM CHLORIDE 0.45 % IV SOLN: INTRAVENOUS | Status: DC

## 2020-06-03 MED ORDER — ONDANSETRON HCL 4 MG/2ML IJ SOLN
4.0000 mg | Freq: Once | INTRAMUSCULAR | Status: AC
  Administered 2020-06-03: 4 mg via INTRAVENOUS
  Filled 2020-06-03: qty 2

## 2020-06-03 MED ORDER — SODIUM CHLORIDE 0.9 % IV BOLUS
1000.0000 mL | Freq: Once | INTRAVENOUS | Status: AC
  Administered 2020-06-03: 1000 mL via INTRAVENOUS

## 2020-06-03 MED ORDER — ACETAMINOPHEN 325 MG PO TABS
650.0000 mg | ORAL_TABLET | Freq: Four times a day (QID) | ORAL | Status: DC | PRN
  Administered 2020-06-05: 650 mg via ORAL
  Filled 2020-06-03: qty 2

## 2020-06-03 MED ORDER — METHOCARBAMOL 500 MG PO TABS
500.0000 mg | ORAL_TABLET | Freq: Four times a day (QID) | ORAL | Status: DC | PRN
  Administered 2020-06-04 – 2020-06-05 (×3): 500 mg via ORAL
  Filled 2020-06-03 (×3): qty 1

## 2020-06-03 MED ORDER — SODIUM CHLORIDE 0.9 % IV SOLN
2.0000 g | Freq: Once | INTRAVENOUS | Status: AC
  Administered 2020-06-03: 2 g via INTRAVENOUS
  Filled 2020-06-03: qty 20

## 2020-06-03 MED ORDER — METOPROLOL TARTRATE 5 MG/5ML IV SOLN: 5.0000 mg | Freq: Four times a day (QID) | INTRAVENOUS | Status: DC | PRN

## 2020-06-03 MED ORDER — IOHEXOL 300 MG/ML  SOLN
100.0000 mL | Freq: Once | INTRAMUSCULAR | Status: AC | PRN
  Administered 2020-06-03: 100 mL via INTRAVENOUS

## 2020-06-03 MED ORDER — OXYCODONE HCL 5 MG PO TABS
5.0000 mg | ORAL_TABLET | ORAL | Status: DC | PRN
  Administered 2020-06-04: 10 mg via ORAL
  Administered 2020-06-04: 5 mg via ORAL
  Administered 2020-06-04 – 2020-06-05 (×5): 10 mg via ORAL
  Filled 2020-06-03: qty 1
  Filled 2020-06-03 (×6): qty 2

## 2020-06-03 NOTE — ED Triage Notes (Signed)
Pt. From home arrived via [facility] c/o upper right abdominal pain that started at 3am. EMS noted abdominal pain mildly tender on palpation but extreme rebound tenderness. Pt. Denies n/v/d.

## 2020-06-03 NOTE — ED Notes (Addendum)
Pt. Transported to [facility]

## 2020-06-03 NOTE — ED Notes (Addendum)
Pt. Declined bedpan and stated that the purewick just wasn't working for her. Pt. Transported to the restroom.

## 2020-06-03 NOTE — ED Provider Notes (Signed)
[facility] EMERGENCY DEPARTMENT Provider Note   CSN: [id number] Arrival date & time: 06/03/20  1926     History Chief Complaint  Patient presents with  . Abdominal Pain    Meagan Ramos is a 44 y.o. female.  HPI      Meagan Ramos is a 44 y.o. female, with a history of chronic pain, tobacco use, opioid addiction, presenting to the ED with abdominal pain beginning around 3:30am this morning, waking her from sleep. Pain is sharp, 10/10, constant, originally nonradiating, now radiating to epigastric region and giving "heartburn."  Radiation also into the right back.  Accompanied by nausea.  She admits to NSAID use perhaps once a week.  Denies alcohol use. Denies fever, vomiting, acute diarrhea, hematochezia/melena, cough, shortness of breath, urinary symptoms, or any other complaints.   No past medical history on file.  Patient Active Problem List   Diagnosis Date Noted  . Cholecystitis 06/03/2020  . Pap smear for cervical cancer screening 01/05/2015  . Chronic cough 12/16/2014  . Chronic left hip pain 12/16/2014  . History of opioid abuse (HCC) 12/16/2014  . Tobacco abuse 12/16/2014  . Family history of thyroid disease 12/16/2014    Past Surgical History:  Procedure Laterality Date  . MANDIBLE SURGERY    . TUBAL LIGATION       OB History   No obstetric history on file.     Family History  Problem Relation Age of Onset  . Cancer Mother        thyroid cancer   . Hypertension Mother   . Cancer Father        lung cancer   . Hypertension Maternal Grandmother   . Cancer Maternal Grandfather        lung cancer     Social History   Tobacco Use  . Smoking status: Current Every Day Smoker    Packs/day: 0.50    Years: 23.00    Pack years: 11.50  Substance Use Topics  . Alcohol use: No    Alcohol/week: 0.0 standard drinks  . Drug use: Not on file    Home Medications Prior to Admission medications   Medication Sig Start Date End  Date Taking? Authorizing Provider  buprenorphine (SUBUTEX) 8 MG SUBL SL tablet Place 8 mg under the tongue in the morning, at noon, and at bedtime.  05/12/20 06/11/20 Yes [provider]  busPIRone (BUSPAR) 10 MG tablet Take 10 mg by mouth 2 (two) times daily.  04/13/20  Yes [provider]  clonazePAM (KLONOPIN) 2 MG tablet Take 1 mg by mouth 2 (two) times daily as needed for anxiety.  05/29/20  Yes [provider]  buPROPion (WELLBUTRIN SR) 150 MG 12 hr tablet Take 1 tablet (150 mg total) by mouth 2 (two) times daily. Patient not taking: Reported on 06/03/2020 09/25/15   Quentin Angst, MD  cyclobenzaprine (FLEXERIL) 10 MG tablet Take 1 tablet (10 mg total) by mouth 2 (two) times daily as needed for muscle spasms. Patient not taking: Reported on 06/03/2020 01/19/15   Elpidio Anis, PA-C  ibuprofen (ADVIL,MOTRIN) 800 MG tablet Take 1 tablet (800 mg total) by mouth 3 (three) times daily. Patient not taking: Reported on 06/03/2020 01/19/15   Elpidio Anis, PA-C  methocarbamol (ROBAXIN) 500 MG tablet Take 1 tablet (500 mg total) by mouth 2 (two) times daily. Patient not taking: Reported on 06/03/2020 07/01/15   Gwyneth Sprout, MD  naproxen (NAPROSYN) 500 MG tablet Take 1 tablet (500  mg total) by mouth 2 (two) times daily. Patient not taking: Reported on 06/03/2020 07/01/15   Gwyneth Sprout, MD    Allergies    Penicillins  Review of Systems   Review of Systems  Constitutional: Negative for chills, diaphoresis and fever.  Respiratory: Negative for shortness of breath.   Cardiovascular: Negative for chest pain and leg swelling.  Gastrointestinal: Positive for abdominal pain and nausea. Negative for blood in stool, diarrhea and vomiting.  Genitourinary: Negative for dysuria and hematuria.  All other systems reviewed and are negative.   Physical Exam Updated Vital Signs BP (!) 156/104 (BP Location: Right Arm)   Pulse 74   Temp 97.9 F (36.6 C) (Oral)   Resp 19    Ht 5\' 2"  (1.575 m)   Wt 86.6 kg   SpO2 100%   BMI 34.93 kg/m   Physical Exam Vitals and nursing note reviewed.  Constitutional:      General: She is in acute distress (pain).     Appearance: She is well-developed. She is not diaphoretic.  HENT:     Head: Normocephalic and atraumatic.     Mouth/Throat:     Mouth: Mucous membranes are moist.     Pharynx: Oropharynx is clear.  Eyes:     Conjunctiva/sclera: Conjunctivae normal.  Cardiovascular:     Rate and Rhythm: Normal rate and regular rhythm.     Pulses: Normal pulses.          Radial pulses are 2+ on the right side and 2+ on the left side.       Posterior tibial pulses are 2+ on the right side and 2+ on the left side.     Heart sounds: Normal heart sounds.     Comments: Tactile temperature in the extremities appropriate and equal bilaterally. Pulmonary:     Effort: Pulmonary effort is normal. No respiratory distress.     Breath sounds: Normal breath sounds.  Abdominal:     Palpations: Abdomen is soft.     Tenderness: There is abdominal tenderness in the right upper quadrant. There is guarding. There is no right CVA tenderness or left CVA tenderness.  Musculoskeletal:     Cervical back: Neck supple.     Right lower leg: No edema.     Left lower leg: No edema.  Lymphadenopathy:     Cervical: No cervical adenopathy.  Skin:    General: Skin is warm and dry.  Neurological:     Mental Status: She is alert.  Psychiatric:        Mood and Affect: Mood and affect normal.        Speech: Speech normal.        Behavior: Behavior normal.     ED Results / Procedures / Treatments   Labs (all labs ordered are listed, but only abnormal results are displayed) Labs Reviewed  COMPREHENSIVE METABOLIC PANEL - Abnormal; Notable for the following components:      Result Value   Calcium 8.8 (*)    Total Protein 6.3 (*)    Albumin 3.4 (*)    All other components within normal limits  SARS CORONAVIRUS 2 BY RT PCR (HOSPITAL ORDER,  PERFORMED IN [facility])  LIPASE, BLOOD  CBC WITH DIFFERENTIAL/PLATELET  URINALYSIS, ROUTINE W REFLEX MICROSCOPIC  I-STAT BETA HCG BLOOD, ED ([facility], [facility], [facility] ONLY)    EKG EKG Interpretation  Date/Time:  Saturday June 03 2020 19:38:31 EDT Ventricular Rate:  80 PR Interval:    QRS Duration:  94 QT Interval:  383 QTC Calculation: 442 R Axis:   125 Text Interpretation: Right and left arm electrode reversal, interpretation assumes no reversal Sinus rhythm Inferior infarct, old Lateral leads are also involved Confirmed by Raeford Razor [phone]) on 06/03/2020 8:32:13 PM   Radiology CT ABDOMEN PELVIS W CONTRAST  Result Date: 06/03/2020 CLINICAL DATA:  Upper abdominal pain, nausea, diarrhea EXAM: CT ABDOMEN AND PELVIS WITH CONTRAST TECHNIQUE: Multidetector CT imaging of the abdomen and pelvis was performed using the standard protocol following bolus administration of intravenous contrast. CONTRAST:  OMNIPAQUE IOHEXOL 300 MG/ML  SOLN COMPARISON:  06/03/2020, 07/01/2015 FINDINGS: Lower chest: Scattered areas of ground-glass attenuation within the dependent lower lobes likely reflect hypoventilatory change. No acute pleural or parenchymal lung disease. Hepatobiliary: There are no focal liver abnormalities. Gallbladder is moderately distended, with trace pericholecystic fluid. No gallstones or gallbladder wall thickening. Common bile duct measures 6 mm. No evidence of choledocholithiasis. No intrahepatic biliary duct dilation. Pancreas: Unremarkable. No pancreatic ductal dilatation or surrounding inflammatory changes. Spleen: Normal in size without focal abnormality. Adrenals/Urinary Tract: Adrenal glands are unremarkable. Kidneys are normal, without renal calculi, focal lesion, or hydronephrosis. Bladder is unremarkable. Stomach/Bowel: No bowel obstruction or ileus. No wall thickening or inflammatory change. The appendix is identified. Vascular/Lymphatic: No significant vascular findings are  present. No enlarged abdominal or pelvic lymph nodes. Reproductive: Uterus and bilateral adnexa are unremarkable. Other: No abdominal wall hernia or abnormality. No abdominopelvic ascites. Musculoskeletal: No acute or destructive bony lesions. Reconstructed images demonstrate no additional findings. IMPRESSION: 1. Distended gallbladder with trace pericholecystic fluid. Findings are nonspecific, but could reflect acute cholecystitis. If further evaluation is desired, nuclear medicine hepatobiliary scan could be considered. 2. Otherwise no acute intra-abdominal or intrapelvic process. Electronically Signed   By: Sharlet Salina M.D.   On: 06/03/2020 21:36   US Abdomen Limited RUQ  Result Date: 06/03/2020 CLINICAL DATA:  Right upper quadrant pain since 3 a.m. EXAM: ULTRASOUND ABDOMEN LIMITED RIGHT UPPER QUADRANT COMPARISON:  None. FINDINGS: Gallbladder: No evidence of cholelithiasis or cholecystitis. By my measurement, the gallbladder wall measures less than 3 mm in thickness. No pericholecystic fluid. No sonographic Murphy sign noted by sonographer. Common bile duct: Diameter: 8 mm Liver: No focal lesion identified. Within normal limits in parenchymal echogenicity. Portal vein is patent on color Doppler imaging with normal direction of blood flow towards the liver. Other: None. IMPRESSION: 1. No evidence of cholelithiasis or cholecystitis. 2. Mildly dilated common bile duct measuring 8 mm, of uncertain etiology. No evidence of choledocholithiasis or intrahepatic biliary duct dilation. Electronically Signed   By: Sharlet Salina M.D.   On: 06/03/2020 20:42    Procedures Procedures (including critical care time)  Medications Ordered in ED Medications  cefTRIAXone (ROCEPHIN) 2 g in sodium chloride 0.9 % 100 mL IVPB (has no administration in time range)  morphine 4 MG/ML injection 4 mg (has no administration in time range)  ondansetron (ZOFRAN) injection 4 mg (4 mg Intravenous Given 06/03/20 2012)  morphine 4  MG/ML injection 4 mg (4 mg Intravenous Given 06/03/20 2013)  sodium chloride 0.9 % bolus 1,000 mL (0 mLs Intravenous Stopped 06/03/20 2210)  iohexol (OMNIPAQUE) 300 MG/ML solution 100 mL (100 mLs Intravenous Contrast Given 06/03/20 2114)    ED Course  I have reviewed the triage vital signs and the nursing notes.  Pertinent labs & imaging results that were available during my care of the patient were reviewed by me and considered in my medical decision making (see chart for  details).  Clinical Course as of Jun 03 2340  Sat Jun 03, 2020  2239 Spoke with Dr. Janee Mornhompson, general surgery. States he will work on getting the patient admitted.    [SJ]    Clinical Course User Index [SJ] Brecklynn Jian, Hillard DankerShawn C, PA-C   MDM Rules/Calculators/A&P                          Patient presents with onset of abdominal pain and nausea with right upper quadrant tenderness. Afebrile, nontoxic-appearing, not tachycardic.  No leukocytosis. I personally reviewed and interpreted the patient's labs and imaging studies. Possible common bile duct dilation on ultrasound.  Lab work reassuring. CT with possible distended gallbladder and pericholecystic fluid. Based on the patient's persistent pain and the location of her pain and tenderness, suspicion for cholecystitis.  Findings and plan of care discussed with Raeford RazorStephen Kohut, MD.   Vitals:   06/03/20 1931 06/03/20 2207 06/03/20 2246  BP: (!) 156/104 127/85 122/81  Pulse: 74 85 82  Resp: 19 (!) 25 (!) 26  Temp: 97.9 F (36.6 C)    TempSrc: Oral    SpO2: 100% 100% 100%  Weight: 86.6 kg    Height: 5\' 2"  (1.575 m)       Final Clinical Impression(s) / ED Diagnoses Final diagnoses:  RUQ abdominal tenderness    Rx / DC Orders ED Discharge Orders    None       Concepcion LivingJoy, Asia Favata C, PA-C 06/03/20 2343    Raeford RazorKohut, Stephen, MD 06/04/20 Paulo [id number]

## 2020-06-03 NOTE — H&P (Signed)
Meagan Ramos is an 44 y.o. female.   Chief Complaint: RUQ pain HPI: 44yo F presented to the ED via EMS C/O RUQ pain and associated N/V which woke her up. U/S showed mildly dilated CBD but no cholelithiasis. CT A/P was then done which shows a distended and inflamed GB C/W cholecystitis. LFTs and lipase are WNL. She continues to have a lot of pain. I was asked to see her for surgical management.  No past medical history on file.  Past Surgical History:  Procedure Laterality Date  . MANDIBLE SURGERY    . TUBAL LIGATION      Family History  Problem Relation Age of Onset  . Cancer Mother        thyroid cancer   . Hypertension Mother   . Cancer Father        lung cancer   . Hypertension Maternal Grandmother   . Cancer Maternal Grandfather        lung cancer    Social History:  reports that she has been smoking. She has a 11.50 pack-year smoking history. She does not have any smokeless tobacco history on file. She reports that she does not drink alcohol. No history on file for drug use.  Allergies:  Allergies  Allergen Reactions  . Penicillins Rash    Childhood allergy     (Not in a hospital admission)   Results for orders placed or performed during the hospital encounter of 06/03/20 (from the past 48 hour(s))  Comprehensive metabolic panel     Status: Abnormal   Collection Time: 06/03/20  8:18 PM  Result Value Ref Range   Sodium 140 135 - 145 mmol/L   Potassium 3.6 3.5 - 5.1 mmol/L   Chloride 107 98 - 111 mmol/L   CO2 24 22 - 32 mmol/L   Glucose, Bld 98 70 - 99 mg/dL    Comment: Glucose reference range applies only to samples taken after fasting for at least 8 hours.   BUN 10 6 - 20 mg/dL   Creatinine, Ser 1.01 0.44 - 1.00 mg/dL   Calcium 8.8 (L) 8.9 - 10.3 mg/dL   Total Protein 6.3 (L) 6.5 - 8.1 g/dL   Albumin 3.4 (L) 3.5 - 5.0 g/dL   AST 32 15 - 41 U/L   ALT 20 0 - 44 U/L   Alkaline Phosphatase 87 38 - 126 U/L   Total Bilirubin 0.3 0.3 - 1.2 mg/dL   GFR calc non  Af Amer >60 >60 mL/min   GFR calc Af Amer >60 >60 mL/min   Anion gap 9 5 - 15    Comment: Performed at [facility], [address], [place], [place] [address]  Lipase, blood     Status: None   Collection Time: 06/03/20  8:18 PM  Result Value Ref Range   Lipase 20 11 - 51 U/L    Comment: Performed at [facility], [address] [place], [place] [address]  CBC with Differential     Status: None   Collection Time: 06/03/20  8:18 PM  Result Value Ref Range   WBC 8.1 4.0 - 10.5 K/uL   RBC 4.67 3.87 - 5.11 MIL/uL   Hemoglobin 13.5 12.0 - 15.0 g/dL   HCT 78.2 36 - 46 %   MCV 89.9 80.0 - 100.0 fL   MCH 28.9 26.0 - 34.0 pg   MCHC 32.1 30.0 - 36.0 g/dL   RDW 42.3 53.6 - 14.4 %   Platelets  203 150 - 400 K/uL   nRBC 0.0 0.0 - 0.2 %   Neutrophils Relative % 66 %   Neutro Abs 5.3 1.7 - 7.7 K/uL   Lymphocytes Relative 23 %   Lymphs Abs 1.9 0.7 - 4.0 K/uL   Monocytes Relative 8 %   Monocytes Absolute 0.7 0 - 1 K/uL   Eosinophils Relative 2 %   Eosinophils Absolute 0.2 0 - 0 K/uL   Basophils Relative 1 %   Basophils Absolute 0.1 0 - 0 K/uL   Immature Granulocytes 0 %   Abs Immature Granulocytes 0.02 0.00 - 0.07 K/uL    Comment: Performed at [facility], [address] [place], [place] [address]  I-Stat beta hCG blood, ED     Status: None   Collection Time: 06/03/20  8:24 PM  Result Value Ref Range   I-stat hCG, quantitative <5.0 <5 mIU/mL   Comment 3            Comment:   GEST. AGE      CONC.  (mIU/mL)   <=1 WEEK        5 - 50     2 WEEKS       50 - 500     3 WEEKS       100 - 10,000     4 WEEKS     1,000 - 30,000        FEMALE AND NON-PREGNANT FEMALE:     LESS THAN 5 mIU/mL    CT ABDOMEN PELVIS W CONTRAST  Result Date: 06/03/2020 CLINICAL DATA:  Upper abdominal pain, nausea, diarrhea EXAM: CT ABDOMEN AND PELVIS WITH CONTRAST TECHNIQUE: Multidetector CT imaging of the abdomen and pelvis was performed using the standard protocol following bolus administration of  intravenous contrast. CONTRAST:  OMNIPAQUE IOHEXOL 300 MG/ML  SOLN COMPARISON:  06/03/2020, 07/01/2015 FINDINGS: Lower chest: Scattered areas of ground-glass attenuation within the dependent lower lobes likely reflect hypoventilatory change. No acute pleural or parenchymal lung disease. Hepatobiliary: There are no focal liver abnormalities. Gallbladder is moderately distended, with trace pericholecystic fluid. No gallstones or gallbladder wall thickening. Common bile duct measures 6 mm. No evidence of choledocholithiasis. No intrahepatic biliary duct dilation. Pancreas: Unremarkable. No pancreatic ductal dilatation or surrounding inflammatory changes. Spleen: Normal in size without focal abnormality. Adrenals/Urinary Tract: Adrenal glands are unremarkable. Kidneys are normal, without renal calculi, focal lesion, or hydronephrosis. Bladder is unremarkable. Stomach/Bowel: No bowel obstruction or ileus. No wall thickening or inflammatory change. The appendix is identified. Vascular/Lymphatic: No significant vascular findings are present. No enlarged abdominal or pelvic lymph nodes. Reproductive: Uterus and bilateral adnexa are unremarkable. Other: No abdominal wall hernia or abnormality. No abdominopelvic ascites. Musculoskeletal: No acute or destructive bony lesions. Reconstructed images demonstrate no additional findings. IMPRESSION: 1. Distended gallbladder with trace pericholecystic fluid. Findings are nonspecific, but could reflect acute cholecystitis. If further evaluation is desired, nuclear medicine hepatobiliary scan could be considered. 2. Otherwise no acute intra-abdominal or intrapelvic process. Electronically Signed   By: Sharlet Salina M.D.   On: 06/03/2020 21:36   US Abdomen Limited RUQ  Result Date: 06/03/2020 CLINICAL DATA:  Right upper quadrant pain since 3 a.m. EXAM: ULTRASOUND ABDOMEN LIMITED RIGHT UPPER QUADRANT COMPARISON:  None. FINDINGS: Gallbladder: No evidence of cholelithiasis or  cholecystitis. By my measurement, the gallbladder wall measures less than 3 mm in thickness. No pericholecystic fluid. No sonographic Murphy sign noted by sonographer. Common bile duct: Diameter: 8 mm Liver: No focal lesion identified.  Within normal limits in parenchymal echogenicity. Portal vein is patent on color Doppler imaging with normal direction of blood flow towards the liver. Other: None. IMPRESSION: 1. No evidence of cholelithiasis or cholecystitis. 2. Mildly dilated common bile duct measuring 8 mm, of uncertain etiology. No evidence of choledocholithiasis or intrahepatic biliary duct dilation. Electronically Signed   By: Sharlet Salina M.D.   On: 06/03/2020 20:42    Review of Systems  Constitutional: Negative.   HENT: Negative.   Eyes: Negative.   Respiratory: Negative for shortness of breath.   Cardiovascular: Negative for chest pain.  Gastrointestinal: Positive for abdominal pain, nausea and vomiting.  Endocrine: Negative.   Genitourinary: Negative.   Musculoskeletal: Negative.   Skin: Negative.   Allergic/Immunologic: Negative.   Neurological: Negative.   Hematological: Negative.   Psychiatric/Behavioral: Negative.     Blood pressure 127/85, pulse 85, temperature 97.9 F (36.6 C), temperature source Oral, resp. rate (!) 25, height 5\' 2"  (1.575 m), weight 86.6 kg, SpO2 100 %. Physical Exam Constitutional:      General: She is not in acute distress.    Appearance: She is well-developed.  HENT:     Head: Normocephalic.     Mouth/Throat:     Mouth: Mucous membranes are moist.  Eyes:     General: No scleral icterus.    Extraocular Movements: Extraocular movements intact.     Pupils: Pupils are equal, round, and reactive to light.  Cardiovascular:     Rate and Rhythm: Normal rate and regular rhythm.     Heart sounds: Normal heart sounds. No murmur heard.   Pulmonary:     Effort: Pulmonary effort is normal. No respiratory distress.     Breath sounds: No stridor. No  wheezing or rhonchi.  Abdominal:     General: Abdomen is flat. Bowel sounds are decreased.     Palpations: Abdomen is soft. There is no hepatomegaly or splenomegaly.     Tenderness: There is abdominal tenderness in the right upper quadrant. There is no guarding or rebound.     Hernia: No hernia is present.  Skin:    General: Skin is warm and dry.  Neurological:     Mental Status: She is alert and oriented to person, place, and time.  Psychiatric:        Mood and Affect: Mood is anxious.      Assessment/Plan Cholecystitis -admit, Rocephin IV, NPO, plan lap chole in AM by Dr. . I discussed the procedure, risks, and benefits with her. She agrees.  HX of opioid addiction - clean for 15 years, she reports she has done  OK with short-term narcotics. Hold suboxone. Fredricka Bonine, MD 06/03/2020, 10:53 PM

## 2020-06-03 NOTE — ED Notes (Signed)
Pt. Transported to CT 

## 2020-06-04 ENCOUNTER — Encounter (HOSPITAL_COMMUNITY)
Admission: EM | Disposition: A | Discharge status: Home / Self Care | Payer: Self-pay | Source: Home / Self Care | Attending: Emergency Medicine

## 2020-06-04 ENCOUNTER — Observation Stay (HOSPITAL_COMMUNITY): Payer: Self-pay | Admitting: Critical Care Medicine

## 2020-06-04 ENCOUNTER — Encounter (HOSPITAL_COMMUNITY): Payer: Self-pay | Admitting: General Surgery

## 2020-06-04 HISTORY — PX: CHOLECYSTECTOMY: SHX55

## 2020-06-04 LAB — URINALYSIS, ROUTINE W REFLEX MICROSCOPIC
Bacteria, UA: NONE SEEN
Bilirubin Urine: NEGATIVE
Glucose, UA: NEGATIVE mg/dL
Ketones, ur: NEGATIVE mg/dL
Leukocytes,Ua: NEGATIVE
Nitrite: NEGATIVE
Protein, ur: NEGATIVE mg/dL
Specific Gravity, Urine: 1.032 — ABNORMAL HIGH (ref 1.005–1.030)
pH: 6 (ref 5.0–8.0)

## 2020-06-04 LAB — CBC
HCT: 37.8 % (ref 36.0–46.0)
Hemoglobin: 12.1 g/dL (ref 12.0–15.0)
MCH: 28.7 pg (ref 26.0–34.0)
MCHC: 32 g/dL (ref 30.0–36.0)
MCV: 89.8 fL (ref 80.0–100.0)
Platelets: 174 10*3/uL (ref 150–400)
RBC: 4.21 MIL/uL (ref 3.87–5.11)
RDW: 13.4 % (ref 11.5–15.5)
WBC: 8.6 10*3/uL (ref 4.0–10.5)
nRBC: 0 % (ref 0.0–0.2)

## 2020-06-04 LAB — COMPREHENSIVE METABOLIC PANEL
ALT: 100 U/L — ABNORMAL HIGH (ref 0–44)
AST: 172 U/L — ABNORMAL HIGH (ref 15–41)
Albumin: 3 g/dL — ABNORMAL LOW (ref 3.5–5.0)
Alkaline Phosphatase: 120 U/L (ref 38–126)
Anion gap: 8 (ref 5–15)
BUN: 7 mg/dL (ref 6–20)
CO2: 23 mmol/L (ref 22–32)
Calcium: 8.1 mg/dL — ABNORMAL LOW (ref 8.9–10.3)
Chloride: 107 mmol/L (ref 98–111)
Creatinine, Ser: 0.72 mg/dL (ref 0.44–1.00)
GFR calc Af Amer: >60 mL/min (ref >60)
GFR calc non Af Amer: >60 mL/min (ref >60)
Glucose, Bld: 118 mg/dL — ABNORMAL HIGH (ref 70–99)
Potassium: 4 mmol/L (ref 3.5–5.1)
Sodium: 138 mmol/L (ref 135–145)
Total Bilirubin: 0.4 mg/dL (ref 0.3–1.2)
Total Protein: 5.7 g/dL — ABNORMAL LOW (ref 6.5–8.1)

## 2020-06-04 LAB — SURGICAL PCR SCREEN
MRSA, PCR: NEGATIVE
Staphylococcus aureus: NEGATIVE

## 2020-06-04 LAB — HIV ANTIBODY (ROUTINE TESTING W REFLEX): HIV Screen 4th Generation wRfx: NONREACTIVE

## 2020-06-04 LAB — SARS CORONAVIRUS 2 BY RT PCR (HOSPITAL ORDER, PERFORMED IN ~~LOC~~ HOSPITAL LAB): SARS Coronavirus 2: NEGATIVE

## 2020-06-04 SURGERY — LAPAROSCOPIC CHOLECYSTECTOMY
Anesthesia: General | Site: Abdomen

## 2020-06-04 MED ORDER — DEXAMETHASONE SODIUM PHOSPHATE 10 MG/ML IJ SOLN
INTRAMUSCULAR | Status: DC | PRN
  Administered 2020-06-04: 4 mg via INTRAVENOUS

## 2020-06-04 MED ORDER — LACTATED RINGERS IV SOLN: INTRAVENOUS | Status: DC

## 2020-06-04 MED ORDER — ONDANSETRON HCL 4 MG/2ML IJ SOLN
INTRAMUSCULAR | Status: DC | PRN
  Administered 2020-06-04: 4 mg via INTRAVENOUS

## 2020-06-04 MED ORDER — BUPIVACAINE-EPINEPHRINE 0.25% -1:200000 IJ SOLN
INTRAMUSCULAR | Status: DC | PRN
  Administered 2020-06-04: 7 mL
  Administered 2020-06-04: 15 mL

## 2020-06-04 MED ORDER — LIDOCAINE 2% (20 MG/ML) 5 ML SYRINGE
INTRAMUSCULAR | Status: AC
  Filled 2020-06-04: qty 5

## 2020-06-04 MED ORDER — HYDROMORPHONE HCL 1 MG/ML IJ SOLN
0.5000 mg | INTRAMUSCULAR | Status: DC | PRN
  Administered 2020-06-04 (×2): 0.5 mg via INTRAVENOUS
  Filled 2020-06-04 (×2): qty 1

## 2020-06-04 MED ORDER — SUCCINYLCHOLINE CHLORIDE 200 MG/10ML IV SOSY
PREFILLED_SYRINGE | INTRAVENOUS | Status: AC
  Filled 2020-06-04: qty 10

## 2020-06-04 MED ORDER — 0.9 % SODIUM CHLORIDE (POUR BTL) OPTIME
TOPICAL | Status: DC | PRN
  Administered 2020-06-04: 1000 mL

## 2020-06-04 MED ORDER — SUGAMMADEX SODIUM 200 MG/2ML IV SOLN
INTRAVENOUS | Status: DC | PRN
  Administered 2020-06-04: 180 mg via INTRAVENOUS

## 2020-06-04 MED ORDER — DOCUSATE SODIUM 100 MG PO CAPS
100.0000 mg | ORAL_CAPSULE | Freq: Two times a day (BID) | ORAL | Status: DC
  Administered 2020-06-04 – 2020-06-05 (×3): 100 mg via ORAL
  Filled 2020-06-04 (×3): qty 1

## 2020-06-04 MED ORDER — CHLORHEXIDINE GLUCONATE 0.12 % MT SOLN
15.0000 mL | Freq: Once | OROMUCOSAL | Status: AC
  Administered 2020-06-04: 15 mL via OROMUCOSAL
  Filled 2020-06-04: qty 15

## 2020-06-04 MED ORDER — ORAL CARE MOUTH RINSE: 15.0000 mL | Freq: Once | OROMUCOSAL | Status: DC

## 2020-06-04 MED ORDER — CHLORHEXIDINE GLUCONATE 0.12 % MT SOLN: 15.0000 mL | Freq: Once | OROMUCOSAL | Status: DC

## 2020-06-04 MED ORDER — FENTANYL CITRATE (PF) 100 MCG/2ML IJ SOLN: 25.0000 ug | INTRAMUSCULAR | Status: DC | PRN

## 2020-06-04 MED ORDER — PROMETHAZINE HCL 25 MG/ML IJ SOLN: 6.2500 mg | INTRAMUSCULAR | Status: DC | PRN

## 2020-06-04 MED ORDER — ACETAMINOPHEN 500 MG PO TABS
1000.0000 mg | ORAL_TABLET | Freq: Once | ORAL | Status: AC
  Administered 2020-06-04: 1000 mg via ORAL
  Filled 2020-06-04: qty 2

## 2020-06-04 MED ORDER — ROCURONIUM BROMIDE 10 MG/ML (PF) SYRINGE
PREFILLED_SYRINGE | INTRAVENOUS | Status: DC | PRN
  Administered 2020-06-04: 60 mg via INTRAVENOUS

## 2020-06-04 MED ORDER — FENTANYL CITRATE (PF) 250 MCG/5ML IJ SOLN
INTRAMUSCULAR | Status: AC
  Filled 2020-06-04: qty 5

## 2020-06-04 MED ORDER — POTASSIUM CHLORIDE IN NACL 20-0.45 MEQ/L-% IV SOLN
INTRAVENOUS | Status: DC
  Filled 2020-06-04 (×4): qty 1000

## 2020-06-04 MED ORDER — LIDOCAINE 2% (20 MG/ML) 5 ML SYRINGE
INTRAMUSCULAR | Status: DC | PRN
  Administered 2020-06-04: 80 mg via INTRAVENOUS

## 2020-06-04 MED ORDER — PROPOFOL 10 MG/ML IV BOLUS
INTRAVENOUS | Status: DC | PRN
  Administered 2020-06-04: 170 mg via INTRAVENOUS

## 2020-06-04 MED ORDER — PROPOFOL 10 MG/ML IV BOLUS
INTRAVENOUS | Status: AC
  Filled 2020-06-04: qty 20

## 2020-06-04 MED ORDER — PHENYLEPHRINE 40 MCG/ML (10ML) SYRINGE FOR IV PUSH (FOR BLOOD PRESSURE SUPPORT)
PREFILLED_SYRINGE | INTRAVENOUS | Status: AC
  Filled 2020-06-04: qty 10

## 2020-06-04 MED ORDER — ONDANSETRON HCL 4 MG/2ML IJ SOLN
INTRAMUSCULAR | Status: AC
  Filled 2020-06-04: qty 2

## 2020-06-04 MED ORDER — BUPIVACAINE-EPINEPHRINE (PF) 0.25% -1:200000 IJ SOLN
INTRAMUSCULAR | Status: AC
  Filled 2020-06-04: qty 30

## 2020-06-04 MED ORDER — SODIUM CHLORIDE 0.9 % IR SOLN
Status: DC | PRN
  Administered 2020-06-04: 3000 mL

## 2020-06-04 MED ORDER — MIDAZOLAM HCL 5 MG/5ML IJ SOLN
INTRAMUSCULAR | Status: DC | PRN
  Administered 2020-06-04: 2 mg via INTRAVENOUS

## 2020-06-04 MED ORDER — DEXAMETHASONE SODIUM PHOSPHATE 10 MG/ML IJ SOLN
INTRAMUSCULAR | Status: AC
  Filled 2020-06-04: qty 1

## 2020-06-04 MED ORDER — FENTANYL CITRATE (PF) 250 MCG/5ML IJ SOLN
INTRAMUSCULAR | Status: DC | PRN
  Administered 2020-06-04 (×2): 50 ug via INTRAVENOUS
  Administered 2020-06-04: 100 ug via INTRAVENOUS

## 2020-06-04 MED ORDER — MIDAZOLAM HCL 2 MG/2ML IJ SOLN
INTRAMUSCULAR | Status: AC
  Filled 2020-06-04: qty 2

## 2020-06-04 MED ORDER — PHENYLEPHRINE 40 MCG/ML (10ML) SYRINGE FOR IV PUSH (FOR BLOOD PRESSURE SUPPORT)
PREFILLED_SYRINGE | INTRAVENOUS | Status: DC | PRN
  Administered 2020-06-04: 80 ug via INTRAVENOUS
  Administered 2020-06-04: 40 ug via INTRAVENOUS

## 2020-06-04 MED ORDER — ORAL CARE MOUTH RINSE: 15.0000 mL | Freq: Once | OROMUCOSAL | Status: AC

## 2020-06-04 SURGICAL SUPPLY — 37 items
ADH SKN CLS APL DERMABOND .7 (GAUZE/BANDAGES/DRESSINGS) ×1
APL PRP STRL LF DISP 70% ISPRP (MISCELLANEOUS) ×1
APPLIER CLIP 5 13 M/L LIGAMAX5 (MISCELLANEOUS) ×3
APR CLP MED LRG 5 ANG JAW (MISCELLANEOUS) ×1
BAG SPEC RTRVL LRG 6X4 10 (ENDOMECHANICALS) ×1
CANISTER SUCT 3000ML PPV (MISCELLANEOUS) ×3 IMPLANT
CHLORAPREP W/TINT 26 (MISCELLANEOUS) ×3 IMPLANT
CLIP APPLIE 5 13 M/L LIGAMAX5 (MISCELLANEOUS) ×1 IMPLANT
COVER SURGICAL LIGHT HANDLE (MISCELLANEOUS) ×3 IMPLANT
DERMABOND ADVANCED (GAUZE/BANDAGES/DRESSINGS) ×2
DERMABOND ADVANCED .7 DNX12 (GAUZE/BANDAGES/DRESSINGS) ×1 IMPLANT
ELECT REM PT RETURN 9FT ADLT (ELECTROSURGICAL) ×3
ELECTRODE REM PT RTRN 9FT ADLT (ELECTROSURGICAL) ×1 IMPLANT
GLOVE BIO SURGEON STRL SZ 6 (GLOVE) ×11 IMPLANT
GLOVE INDICATOR 6.5 STRL GRN (GLOVE) ×9 IMPLANT
GOWN STRL REUS W/ TWL LRG LVL3 (GOWN DISPOSABLE) ×3 IMPLANT
GOWN STRL REUS W/TWL LRG LVL3 (GOWN DISPOSABLE) ×12
GRASPER SUT TROCAR 14GX15 (MISCELLANEOUS) ×5 IMPLANT
KIT BASIN OR (CUSTOM PROCEDURE TRAY) ×3 IMPLANT
KIT TURNOVER KIT B (KITS) ×3 IMPLANT
NDL INSUFFLATION 14GA 120MM (NEEDLE) ×1 IMPLANT
NEEDLE INSUFFLATION 14GA 120MM (NEEDLE) ×3 IMPLANT
NS IRRIG 1000ML POUR BTL (IV SOLUTION) ×3 IMPLANT
PAD ARMBOARD 7.5X6 YLW CONV (MISCELLANEOUS) ×3 IMPLANT
POUCH SPECIMEN RETRIEVAL 10MM (ENDOMECHANICALS) ×3 IMPLANT
SCISSORS LAP 5X35 DISP (ENDOMECHANICALS) ×3 IMPLANT
SET IRRIG TUBING LAPAROSCOPIC (IRRIGATION / IRRIGATOR) ×3 IMPLANT
SET TUBE SMOKE EVAC HIGH FLOW (TUBING) ×3 IMPLANT
SLEEVE ENDOPATH XCEL 5M (ENDOMECHANICALS) ×6 IMPLANT
SPECIMEN JAR SMALL (MISCELLANEOUS) ×3 IMPLANT
SUT MNCRL AB 3-0 PS2 18 (SUTURE) ×2 IMPLANT
SUT VICRYL 0 AB UR-6 (SUTURE) ×2 IMPLANT
TOWEL GREEN STERILE (TOWEL DISPOSABLE) IMPLANT
TOWEL GREEN STERILE FF (TOWEL DISPOSABLE) ×3 IMPLANT
TRAY LAPAROSCOPIC MC (CUSTOM PROCEDURE TRAY) ×3 IMPLANT
TROCAR XCEL NON-BLD 11X100MML (ENDOMECHANICALS) ×3 IMPLANT
TROCAR XCEL NON-BLD 5MMX100MML (ENDOMECHANICALS) ×3 IMPLANT

## 2020-06-04 NOTE — Interval H&P Note (Signed)
History and Physical Interval Note:  06/04/2020 7:22 AM  Meagan Ramos  has presented today for surgery, with the diagnosis of Cholelithiasis.  The various methods of treatment have been discussed with the patient and family. After consideration of risks, benefits and other options for treatment, the patient has consented to  Procedure(s): LAPAROSCOPIC CHOLECYSTECTOMY IOC (N/A) as a surgical intervention.  The patient's history has been reviewed, patient examined, no change in status, stable for surgery.  I have reviewed the patient's chart and labs.  Questions were answered to the patient's satisfaction.     [name]

## 2020-06-04 NOTE — Plan of Care (Signed)
  Problem: Education: Goal: Knowledge of General Education information will improve Description Including pain rating scale, medication(s)/side effects and non-pharmacologic comfort measures Outcome: Progressing   

## 2020-06-04 NOTE — Progress Notes (Signed)
Received patient from PACU. Patient alert and oriented. 4 lap sites, clean, dry, and intact. Patient oriented to call bell and bed controls. Instructed patient not to self ambulate and to utilize call bell for assistance. Will continue to monitor.

## 2020-06-04 NOTE — OR Nursing (Signed)
LIGAMAX: ENDOSCOPIC MULTIPLE CLIP APPLIER #[id number], LOT [id number], EXP. 02/08/25 USED BY SURGEON.

## 2020-06-04 NOTE — Op Note (Signed)
Operative Note  Meagan Ramos 44 y.o. female [id number]  06/04/2020  Surgeon: Berna Bue MD FACS  Procedure performed: Laparoscopic Cholecystectomy  Preop diagnosis: acute cholecystitis Post-op diagnosis/intraop findings: same  Specimens: gallbladder  Retained items: none  EBL: minimal  Complications: none  Description of procedure: After obtaining informed consent the patient was brought to the operating room. Antibiotics were administered. SCD's were applied. General endotracheal anesthesia was initiated and a formal time-out was performed. The abdomen was prepped and draped in the usual sterile fashion and the abdomen was entered using an infraumbilical veress needle after instilling the site with local. Insufflation to was obtained, 29mm trocar and camera inserted, and gross inspection revealed no evidence of injury from our entry or other intraabdominal abnormalities. Two 58mm trocars were introduced in the right midclavicular and right anterior axillary lines under direct visualization and following infiltration with local. An 39mm trocar was placed in the epigastrium. The gallbladder was tensely distended, thickened and acutely inflamed. This was decompressed with the nezhat. The gallbladder fundus was retracted cephalad and the infundibulum was retracted laterally. A combination of hook electrocautery and blunt dissection was utilized to clear the peritoneum from the neck and cystic duct, circumferentially isolating the cystic artery and cystic duct and lifting the gallbladder from the cystic plate. The critical view of safety was achieved with the cystic artery, cystic duct, and liver bed visualized between them with no other structures. The artery was diminutive and was divided with cautery. The cystic duct was somewhat shortened due to inflammation and edema. Edema was noted in the tissues surrounding the porta as well. The cystic duct was ligated with three clips on the  proximal end and divided sharply. The gallbladder was dissected from the liver plate using electrocautery. Once freed the gallbladder was placed in an endocatch bag and removed intact through the epigastric trocar site. The right upper quadrant was irrigated and aspirated; the effluent was clear. Hemostasis was once again confirmed, and reinspection of the abdomen revealed no injuries. The clips were well apposed without any bile leak from the cystic duct stump or the liver bed. The 65mm trocar site in the epigastrium was closed with a 0 vicryl in the fascia under direct visualization using a PMI device. The abdomen was desufflated and all trocars removed. The skin incisions were closed with subcuticular monocryl and Dermabond. The patient was awakened, extubated and transported to the recovery room in stable condition.    All counts were correct at the completion of the case.

## 2020-06-04 NOTE — Anesthesia Procedure Notes (Signed)
Procedure Name: Intubation Date/Time: 06/04/2020 7:37 AM Performed by: Wilburn Cornelia, CRNA Pre-anesthesia Checklist: Patient identified, Emergency Drugs available, Suction available, Patient being monitored and Timeout performed Patient Re-evaluated:Patient Re-evaluated prior to induction Oxygen Delivery Method: Circle system utilized Preoxygenation: Pre-oxygenation with 100% oxygen Induction Type: IV induction Ventilation: Mask ventilation without difficulty and Oral airway inserted - appropriate to patient size Laryngoscope Size: Mac and 3 Grade View: Grade I Tube type: Oral Tube size: 7.0 mm Number of attempts: 1 Airway Equipment and Method: Stylet Placement Confirmation: ETT inserted through vocal cords under direct vision,  positive ETCO2,  CO2 detector and breath sounds checked- equal and bilateral Secured at: 21 cm Tube secured with: Tape Dental Injury: Teeth and Oropharynx as per pre-operative assessment

## 2020-06-04 NOTE — Transfer of Care (Signed)
Immediate Anesthesia Transfer of Care Note  Patient: Meagan Ramos  Procedure(s) Performed: LAPAROSCOPIC CHOLECYSTECTOMY (N/A Abdomen)  Patient Location: PACU  Anesthesia Type:General  Level of Consciousness: awake, alert  and oriented  Airway & Oxygen Therapy: Patient Spontanous Breathing and Patient connected to nasal cannula oxygen  Post-op Assessment: Report given to RN and Post -op Vital signs reviewed and stable  Post vital signs: Reviewed and stable  Last Vitals:  Vitals Value Taken Time  BP    Temp    Pulse    Resp    SpO2    HR 91, BP 122/84, RR 10, sats 94% on RA  Last Pain:  Vitals:   06/04/20 0713  TempSrc: Oral  PainSc:       Patients Stated Pain Goal: 0 (06/04/20 0656)  Complications: No complications documented.

## 2020-06-04 NOTE — Plan of Care (Signed)
  Problem: Education: Goal: Knowledge of General Education information will improve Description Including pain rating scale, medication(s)/side effects and non-pharmacologic comfort measures Outcome: Progressing   Problem: Education: Goal: Knowledge of General Education information will improve Description Including pain rating scale, medication(s)/side effects and non-pharmacologic comfort measures Outcome: Progressing   

## 2020-06-04 NOTE — Anesthesia Preprocedure Evaluation (Addendum)
Anesthesia Evaluation  Patient identified by MRN, date of birth, ID band Patient awake    Reviewed: Allergy & Precautions, NPO status , Patient's Chart, lab work & pertinent test results  Airway Mallampati: II  TM Distance: >3 FB Neck ROM: Full    Dental  (+) Dental Advisory Given, Edentulous Upper, Edentulous Lower   Pulmonary Current Smoker and Patient abstained from smoking.,    Pulmonary exam normal breath sounds clear to auscultation       Cardiovascular negative cardio ROS Normal cardiovascular exam Rhythm:Regular Rate:Normal     Neuro/Psych negative neurological ROS  negative psych ROS   GI/Hepatic negative GI ROS,  Cholelithiasis   Endo/Other  Obesity   Renal/GU negative Renal ROS     Musculoskeletal negative musculoskeletal ROS (+)   Abdominal   Peds  Hematology negative hematology ROS (+)   Anesthesia Other Findings Day of surgery medications reviewed with the patient.  Reproductive/Obstetrics negative OB ROS                            Anesthesia Physical Anesthesia Plan  ASA: II  Anesthesia Plan: General   Post-op Pain Management:    Induction: Intravenous  PONV Risk Score and Plan: 3 and Midazolam, Dexamethasone and Ondansetron  Airway Management Planned: Oral ETT  Additional Equipment:   Intra-op Plan:   Post-operative Plan: Extubation in OR  Informed Consent: I have reviewed the patients History and Physical, chart, labs and discussed the procedure including the risks, benefits and alternatives for the proposed anesthesia with the patient or authorized representative who has indicated his/her understanding and acceptance.     Dental advisory given  Plan Discussed with: CRNA  Anesthesia Plan Comments:         Anesthesia Quick Evaluation

## 2020-06-04 NOTE — Anesthesia Postprocedure Evaluation (Signed)
Anesthesia Post Note  Patient: Meagan Ramos  Procedure(s) Performed: LAPAROSCOPIC CHOLECYSTECTOMY (N/A Abdomen)     Patient location during evaluation: PACU Anesthesia Type: General Level of consciousness: awake and alert Pain management: pain level controlled Vital Signs Assessment: post-procedure vital signs reviewed and stable Respiratory status: spontaneous breathing, nonlabored ventilation, respiratory function stable and patient connected to nasal cannula oxygen Cardiovascular status: blood pressure returned to baseline and stable Postop Assessment: no apparent nausea or vomiting Anesthetic complications: no   No complications documented.  Last Vitals:  Vitals:   06/04/20 0905 06/04/20 0939  BP: 111/71 113/78  Pulse: 82 79  Resp: (!) 9 14  Temp: 37 C 36.7 C  SpO2: 93% 93%    Last Pain:  Vitals:   06/04/20 1145  TempSrc:   PainSc: 5                  Cecile Hearing

## 2020-06-05 ENCOUNTER — Encounter (HOSPITAL_COMMUNITY): Payer: Self-pay | Admitting: Surgery

## 2020-06-05 LAB — COMPREHENSIVE METABOLIC PANEL
ALT: 96 U/L — ABNORMAL HIGH (ref 0–44)
AST: 61 U/L — ABNORMAL HIGH (ref 15–41)
Albumin: 2.9 g/dL — ABNORMAL LOW (ref 3.5–5.0)
Alkaline Phosphatase: 99 U/L (ref 38–126)
Anion gap: 6 (ref 5–15)
BUN: 6 mg/dL (ref 6–20)
CO2: 24 mmol/L (ref 22–32)
Calcium: 8.5 mg/dL — ABNORMAL LOW (ref 8.9–10.3)
Chloride: 109 mmol/L (ref 98–111)
Creatinine, Ser: 0.65 mg/dL (ref 0.44–1.00)
GFR calc Af Amer: >60 mL/min (ref >60)
GFR calc non Af Amer: >60 mL/min (ref >60)
Glucose, Bld: 160 mg/dL — ABNORMAL HIGH (ref 70–99)
Potassium: 4.1 mmol/L (ref 3.5–5.1)
Sodium: 139 mmol/L (ref 135–145)
Total Bilirubin: 0.3 mg/dL (ref 0.3–1.2)
Total Protein: 5.7 g/dL — ABNORMAL LOW (ref 6.5–8.1)

## 2020-06-05 LAB — CBC
HCT: 36.1 % (ref 36.0–46.0)
Hemoglobin: 11.7 g/dL — ABNORMAL LOW (ref 12.0–15.0)
MCH: 29.7 pg (ref 26.0–34.0)
MCHC: 32.4 g/dL (ref 30.0–36.0)
MCV: 91.6 fL (ref 80.0–100.0)
Platelets: 163 10*3/uL (ref 150–400)
RBC: 3.94 MIL/uL (ref 3.87–5.11)
RDW: 13.6 % (ref 11.5–15.5)
WBC: 9.5 10*3/uL (ref 4.0–10.5)
nRBC: 0 % (ref 0.0–0.2)

## 2020-06-05 MED ORDER — OXYCODONE HCL 5 MG PO TABS: 5.0000 mg | ORAL_TABLET | Freq: Four times a day (QID) | ORAL | 0 refills | Status: DC | PRN

## 2020-06-05 MED ORDER — ACETAMINOPHEN 325 MG PO TABS: 650.0000 mg | ORAL_TABLET | Freq: Four times a day (QID) | ORAL | Status: DC | PRN

## 2020-06-05 MED ORDER — METHOCARBAMOL 500 MG PO TABS: 500.0000 mg | ORAL_TABLET | Freq: Four times a day (QID) | ORAL | 0 refills | Status: DC | PRN

## 2020-06-05 NOTE — Discharge Instructions (Signed)
[facility] ______[facility] °LAPAROSCOPIC SURGERY: POST OP INSTRUCTIONS °Always review your discharge instruction sheet given to you by the facility where your surgery was performed. °IF YOU HAVE DISABILITY OR FAMILY LEAVE FORMS, YOU MUST BRING THEM TO THE OFFICE FOR PROCESSING.   °DO NOT GIVE THEM TO YOUR DOCTOR. ° °1. A prescription for pain medication may be given to you upon discharge.  Take your pain medication as prescribed, if needed.  If narcotic pain medicine is not needed, then you may take acetaminophen (Tylenol) or ibuprofen (Advil) as needed. °2. Take your usually prescribed medications unless otherwise directed. °3. If you need a refill on your pain medication, please contact your pharmacy.  They will contact our office to request authorization. Prescriptions will not be filled after 5pm or on week-ends. °4. You should follow a light diet the first few days after arrival home, such as soup and crackers, etc.  Be sure to include lots of fluids daily. °5. Most patients will experience some swelling and bruising in the area of the incisions.  Ice packs will help.  Swelling and bruising can take several days to resolve.  °6. It is common to experience some constipation if taking pain medication after surgery.  Increasing fluid intake and taking a stool softener (such as Colace) will usually help or prevent this problem from occurring.  A mild laxative (Milk of Magnesia or Miralax) should be taken according to package instructions if there are no bowel movements after 48 hours. °7. Unless discharge instructions indicate otherwise, you may remove your bandages 24-48 hours after surgery, and you may shower at that time.  You may have steri-strips (small skin tapes) in place directly over the incision.  These strips should be left on the skin for 7-10 days.  If your surgeon used skin glue on the incision, you may shower in 24 hours.  The glue will flake off over the next 2-3 weeks.  Any sutures or  staples will be removed at the office during your follow-up visit. °8. ACTIVITIES:  You may resume regular (light) daily activities beginning the next day--such as daily self-care, walking, climbing stairs--gradually increasing activities as tolerated.  You may have sexual intercourse when it is comfortable.  Refrain from any heavy lifting or straining until approved by your doctor. °a. You may drive when you are no longer taking prescription pain medication, you can comfortably wear a seatbelt, and you can safely maneuver your car and apply brakes. °b. RETURN TO WORK:  __________________________________________________________ °9. You should see your doctor in the office for a follow-up appointment approximately 2-3 weeks after your surgery.  Make sure that you call for this appointment within a day or two after you arrive home to insure a convenient appointment time. °10. OTHER INSTRUCTIONS: __________________________________________________________________________________________________________________________ __________________________________________________________________________________________________________________________ °WHEN TO CALL YOUR DOCTOR: °1. Fever over 101.0 °2. Inability to urinate °3. Continued bleeding from incision. °4. Increased pain, redness, or drainage from the incision. °5. Increasing abdominal pain ° °The clinic staff is available to answer your questions during regular business hours.  Please don’t hesitate to call and ask to speak to one of the nurses for clinical concerns.  If you have a medical emergency, go to the nearest emergency room or call 911.  A surgeon from [facility] is always on call at the hospital. °[address], [address], [place], [place]  [address] ? [address] [address], [place], [place]   [address] °[phone] ? [phone] ? FAX [phone] °Web site:   [email or web address] ° °••••••••• ° ° °Managing Your Pain After Surgery Without  Opioids ° ° ° °Thank you for participating in our program to help patients manage their pain after surgery without opioids. This is part of our effort to provide you with the best care possible, without exposing you or your family to the risk that opioids pose. ° °What pain can I expect after surgery? °You can expect to have some pain after surgery. This is normal. The pain is typically worse the day after surgery, and quickly begins to get better. °Many studies have found that many patients are able to manage their pain after surgery with Over-the-Counter (OTC) medications such as Tylenol and Motrin. If you have a condition that does not allow you to take Tylenol or Motrin, notify your surgical team. ° °How will I manage my pain? °The best strategy for controlling your pain after surgery is around the clock pain control with Tylenol (acetaminophen) and Motrin (ibuprofen or Advil). Alternating these medications with each other allows you to maximize your pain control. In addition to Tylenol and Motrin, you can use heating pads or ice packs on your incisions to help reduce your pain. ° °How will I alternate your regular strength over-the-counter pain medication? °You will take a dose of pain medication every three hours. °; Start by taking 650 mg of Tylenol (2 pills of 325 mg) °; 3 hours later take 600 mg of Motrin (3 pills of 200 mg) °; 3 hours after taking the Motrin take 650 mg of Tylenol °; 3 hours after that take 600 mg of Motrin. ° ° °- 1 - ° °See example - if your first dose of Tylenol is at 12:00 PM ° ° °12:00 PM Tylenol 650 mg (2 pills of 325 mg)  °3:00 PM Motrin 600 mg (3 pills of 200 mg)  °6:00 PM Tylenol 650 mg (2 pills of 325 mg)  °9:00 PM Motrin 600 mg (3 pills of 200 mg)  °Continue alternating every 3 hours  ° °We recommend that you follow this schedule around-the-clock for at least 3 days after surgery, or until you feel that it is no longer needed. Use the table on the last page of this handout to  keep track of the medications you are taking. °Important: °Do not take more than 3000mg of Tylenol or 3200mg of Motrin in a 24-hour period. °Do not take ibuprofen/Motrin if you have a history of bleeding stomach ulcers, severe kidney disease, &/or actively taking a blood thinner ° °What if I still have pain? °If you have pain that is not controlled with the over-the-counter pain medications (Tylenol and Motrin or Advil) you might have what we call “breakthrough” pain. You will receive a prescription for a small amount of an opioid pain medication such as Oxycodone, Tramadol, or Tylenol with Codeine. Use these opioid pills in the first 24 hours after surgery if you have breakthrough pain. Do not take more than 1 pill every 4-6 hours. ° °If you still have uncontrolled pain after using all opioid pills, don't hesitate to call our staff using the number provided. We will help make sure you are managing your pain in the best way possible, and if necessary, we can provide a prescription for additional pain medication. ° ° °Day 1   ° °Time  °Name of Medication Number of pills taken  °Amount of Acetaminophen  °Pain Level  ° °Comments  °AM PM       °AM PM       °  AM PM       °AM PM       °AM PM       °AM PM       °AM PM       °AM PM       °Total Daily amount of Acetaminophen °Do not take more than  3,000 mg per day    ° ° °Day 2   ° °Time  °Name of Medication Number of pills °taken  °Amount of Acetaminophen  °Pain Level  ° °Comments  °AM PM       °AM PM       °AM PM       °AM PM       °AM PM       °AM PM       °AM PM       °AM PM       °Total Daily amount of Acetaminophen °Do not take more than  3,000 mg per day    ° ° °Day 3   ° °Time  °Name of Medication Number of pills taken  °Amount of Acetaminophen  °Pain Level  ° °Comments  °AM PM       °AM PM       °AM PM       °AM PM       ° ° ° °AM PM       °AM PM       °AM PM       °AM PM       °Total Daily amount of Acetaminophen °Do not take more than  3,000 mg per day    ° ° °Day  4   ° °Time  °Name of Medication Number of pills taken  °Amount of Acetaminophen  °Pain Level  ° °Comments  °AM PM       °AM PM       °AM PM       °AM PM       °AM PM       °AM PM       °AM PM       °AM PM       °Total Daily amount of Acetaminophen °Do not take more than  3,000 mg per day    ° ° °Day 5   ° °Time  °Name of Medication Number °of pills taken  °Amount of Acetaminophen  °Pain Level  ° °Comments  °AM PM       °AM PM       °AM PM       °AM PM       °AM PM       °AM PM       °AM PM       °AM PM       °Total Daily amount of Acetaminophen °Do not take more than  3,000 mg per day    ° ° ° °Day 6   ° °Time  °Name of Medication Number of pills °taken  °Amount of Acetaminophen  °Pain Level  °Comments  °AM PM       °AM PM       °AM PM       °AM PM       °AM PM       °AM PM       °AM PM       °AM PM       °Total Daily amount   of Acetaminophen °Do not take more than  3,000 mg per day    ° ° °Day 7   ° °Time  °Name of Medication Number of pills taken  °Amount of Acetaminophen  °Pain Level  ° °Comments  °AM PM       °AM PM       °AM PM       °AM PM       °AM PM       °AM PM       °AM PM       °AM PM       °Total Daily amount of Acetaminophen °Do not take more than  3,000 mg per day    ° ° ° ° °For additional information about how and where to safely dispose of unused opioid °medications - https://www.morepowerfulnc.org ° °Disclaimer: This document contains information and/or instructional materials adapted from [facility] for the typical patient with your condition. It does not replace medical advice from your health care provider because your experience may differ from that of the °typical patient. Talk to your health care provider if you have any questions about this °document, your condition or your treatment plan. °Adapted from [facility] ° ° °

## 2020-06-05 NOTE — Progress Notes (Signed)
Meagan Ramos to be D/C'd  per MD order. Discussed with the patient and all questions fully answered.  VSS, Skin clean, dry and intact without evidence of skin break down, no evidence of skin tears noted.  IV catheter discontinued intact. Site without signs and symptoms of complications. Dressing and pressure applied.  An After Visit Summary was printed and given to the patient. Patient received prescription.  D/c education completed with patient/family including follow up instructions, medication list, d/c activities limitations if indicated, with other d/c instructions as indicated by MD - patient able to verbalize understanding, all questions fully answered.   Patient instructed to return to ED, call 911, or call MD for any changes in condition.   Patient to be escorted via WC, and D/C home via private auto.

## 2020-06-05 NOTE — Discharge Summary (Signed)
[facility] Discharge Summary   Patient ID: Meagan Ramos MRN: [id number] DOB/AGE: 16-May-1976 44 y.o.  Admit date: 06/03/2020 Discharge date: 06/05/2020  Admitting Diagnosis: Acute cholecystitis   Discharge Diagnosis Acute cholecystitis   Consultants None  Imaging: CT ABDOMEN PELVIS W CONTRAST  Result Date: 06/03/2020 CLINICAL DATA:  Upper abdominal pain, nausea, diarrhea EXAM: CT ABDOMEN AND PELVIS WITH CONTRAST TECHNIQUE: Multidetector CT imaging of the abdomen and pelvis was performed using the standard protocol following bolus administration of intravenous contrast. CONTRAST:  OMNIPAQUE IOHEXOL 300 MG/ML  SOLN COMPARISON:  06/03/2020, 07/01/2015 FINDINGS: Lower chest: Scattered areas of ground-glass attenuation within the dependent lower lobes likely reflect hypoventilatory change. No acute pleural or parenchymal lung disease. Hepatobiliary: There are no focal liver abnormalities. Gallbladder is moderately distended, with trace pericholecystic fluid. No gallstones or gallbladder wall thickening. Common bile duct measures 6 mm. No evidence of choledocholithiasis. No intrahepatic biliary duct dilation. Pancreas: Unremarkable. No pancreatic ductal dilatation or surrounding inflammatory changes. Spleen: Normal in size without focal abnormality. Adrenals/Urinary Tract: Adrenal glands are unremarkable. Kidneys are normal, without renal calculi, focal lesion, or hydronephrosis. Bladder is unremarkable. Stomach/Bowel: No bowel obstruction or ileus. No wall thickening or inflammatory change. The appendix is identified. Vascular/Lymphatic: No significant vascular findings are present. No enlarged abdominal or pelvic lymph nodes. Reproductive: Uterus and bilateral adnexa are unremarkable. Other: No abdominal wall hernia or abnormality. No abdominopelvic ascites. Musculoskeletal: No acute or destructive bony lesions. Reconstructed images demonstrate no additional findings.  IMPRESSION: 1. Distended gallbladder with trace pericholecystic fluid. Findings are nonspecific, but could reflect acute cholecystitis. If further evaluation is desired, nuclear medicine hepatobiliary scan could be considered. 2. Otherwise no acute intra-abdominal or intrapelvic process. Electronically Signed   By: Sharlet Salina M.D.   On: 06/03/2020 21:36   US Abdomen Limited RUQ  Result Date: 06/03/2020 CLINICAL DATA:  Right upper quadrant pain since 3 a.m. EXAM: ULTRASOUND ABDOMEN LIMITED RIGHT UPPER QUADRANT COMPARISON:  None. FINDINGS: Gallbladder: No evidence of cholelithiasis or cholecystitis. By my measurement, the gallbladder wall measures less than 3 mm in thickness. No pericholecystic fluid. No sonographic Murphy sign noted by sonographer. Common bile duct: Diameter: 8 mm Liver: No focal lesion identified. Within normal limits in parenchymal echogenicity. Portal vein is patent on color Doppler imaging with normal direction of blood flow towards the liver. Other: None. IMPRESSION: 1. No evidence of cholelithiasis or cholecystitis. 2. Mildly dilated common bile duct measuring 8 mm, of uncertain etiology. No evidence of choledocholithiasis or intrahepatic biliary duct dilation. Electronically Signed   By: Sharlet Salina M.D.   On: 06/03/2020 20:42    Procedures Dr. Fredricka Bonine (06/04/20) - Laparoscopic Cholecystectomy   Hospital Course:  Patient is a 44 year old female who presented to [facility] with abdominal pain.  Workup showed acute cholecystitis.  Patient was admitted and underwent procedure listed above.  Tolerated procedure well and was transferred to the floor.  Diet was advanced as tolerated.  On POD#1, the patient was voiding well, tolerating diet, ambulating well, pain well controlled, vital signs stable, incisions c/d/i and felt stable for discharge home.  Patient will follow up in our office in 3 weeks and knows to call with questions or concerns. She will call to confirm appointment date/time.     Physical Exam: General:  Alert, NAD, pleasant, comfortable Abd:  Soft, ND, mild tenderness, incisions C/D/I  I or a member of my team have reviewed this patient in the Controlled Substance Database.   Allergies as of  06/05/2020      Reactions   Penicillins Rash   Childhood allergy      Medication List    TAKE these medications   acetaminophen 325 MG tablet Commonly known as: TYLENOL Take 2 tablets (650 mg total) by mouth every 6 (six) hours as needed for mild pain (or temp > 100).   buprenorphine 8 MG Subl SL tablet Commonly known as: SUBUTEX Place 8 mg under the tongue in the morning, at noon, and at bedtime.   busPIRone 10 MG tablet Commonly known as: BUSPAR Take 10 mg by mouth 2 (two) times daily.   clonazePAM 2 MG tablet Commonly known as: KLONOPIN Take 1 mg by mouth 2 (two) times daily as needed for anxiety.   methocarbamol 500 MG tablet Commonly known as: ROBAXIN Take 1 tablet (500 mg total) by mouth every 6 (six) hours as needed for muscle spasms.   oxyCODONE 5 MG immediate release tablet Commonly known as: Oxy IR/ROXICODONE Take 1-2 tablets (5-10 mg total) by mouth every 6 (six) hours as needed for moderate pain.         Follow-up Information    [facility].   Specialty: General Surgery Why: Our office is working to schedule your follow up in about 3 weeks.  Be at the office 30 minutes early for check in.  Bring photo ID and insurance information with you. Contact information: [address] [place] [address] [phone]               Signed: Juliet Rude , [facility] 06/05/2020, 8:51 AM Please see Amion for pager number during day hours 7:00am-4:30pm

## 2020-06-06 LAB — SURGICAL PATHOLOGY

## 2020-07-26 DIAGNOSIS — Z9049 Acquired absence of other specified parts of digestive tract: Secondary | ICD-10-CM | POA: Insufficient documentation

## 2020-12-26 ENCOUNTER — Emergency Department (HOSPITAL_COMMUNITY): Payer: Self-pay

## 2020-12-26 ENCOUNTER — Emergency Department (HOSPITAL_COMMUNITY)
Admission: EM | Admit: 2020-12-26 | Discharge: 2020-12-27 | Disposition: A | Discharge status: Home / Self Care | Payer: Self-pay | Attending: Emergency Medicine | Admitting: Emergency Medicine

## 2020-12-26 ENCOUNTER — Encounter (HOSPITAL_COMMUNITY): Payer: Self-pay

## 2020-12-26 ENCOUNTER — Other Ambulatory Visit: Payer: Self-pay

## 2020-12-26 DIAGNOSIS — F17200 Nicotine dependence, unspecified, uncomplicated: Secondary | ICD-10-CM | POA: Insufficient documentation

## 2020-12-26 DIAGNOSIS — R0602 Shortness of breath: Secondary | ICD-10-CM | POA: Insufficient documentation

## 2020-12-26 DIAGNOSIS — R059 Cough, unspecified: Secondary | ICD-10-CM | POA: Insufficient documentation

## 2020-12-26 DIAGNOSIS — Z20822 Contact with and (suspected) exposure to covid-19: Secondary | ICD-10-CM | POA: Insufficient documentation

## 2020-12-26 LAB — CBC WITH DIFFERENTIAL/PLATELET
Abs Immature Granulocytes: 0.02 10*3/uL (ref 0.00–0.07)
Basophils Absolute: 0.1 10*3/uL (ref 0.0–0.1)
Basophils Relative: 1 %
Eosinophils Absolute: 0.3 10*3/uL (ref 0.0–0.5)
Eosinophils Relative: 3 %
HCT: 42.7 % (ref 36.0–46.0)
Hemoglobin: 14.4 g/dL (ref 12.0–15.0)
Immature Granulocytes: 0 %
Lymphocytes Relative: 38 %
Lymphs Abs: 3.8 10*3/uL (ref 0.7–4.0)
MCH: 29.4 pg (ref 26.0–34.0)
MCHC: 33.7 g/dL (ref 30.0–36.0)
MCV: 87.1 fL (ref 80.0–100.0)
Monocytes Absolute: 0.7 10*3/uL (ref 0.1–1.0)
Monocytes Relative: 7 %
Neutro Abs: 5.2 10*3/uL (ref 1.7–7.7)
Neutrophils Relative %: 51 %
Platelets: 251 10*3/uL (ref 150–400)
RBC: 4.9 MIL/uL (ref 3.87–5.11)
RDW: 13.3 % (ref 11.5–15.5)
WBC: 10.2 10*3/uL (ref 4.0–10.5)
nRBC: 0 % (ref 0.0–0.2)

## 2020-12-26 LAB — COMPREHENSIVE METABOLIC PANEL
ALT: 16 U/L (ref 0–44)
AST: 21 U/L (ref 15–41)
Albumin: 3.8 g/dL (ref 3.5–5.0)
Alkaline Phosphatase: 83 U/L (ref 38–126)
Anion gap: 10 (ref 5–15)
BUN: 9 mg/dL (ref 6–20)
CO2: 23 mmol/L (ref 22–32)
Calcium: 9.1 mg/dL (ref 8.9–10.3)
Chloride: 106 mmol/L (ref 98–111)
Creatinine, Ser: 0.71 mg/dL (ref 0.44–1.00)
GFR, Estimated: >60 mL/min (ref >60)
Glucose, Bld: 103 mg/dL — ABNORMAL HIGH (ref 70–99)
Potassium: 3.3 mmol/L — ABNORMAL LOW (ref 3.5–5.1)
Sodium: 139 mmol/L (ref 135–145)
Total Bilirubin: 0.5 mg/dL (ref 0.3–1.2)
Total Protein: 7 g/dL (ref 6.5–8.1)

## 2020-12-26 LAB — D-DIMER, QUANTITATIVE: D-Dimer, Quant: 0.56 ug/mL-FEU — ABNORMAL HIGH (ref 0.00–0.50)

## 2020-12-26 LAB — BRAIN NATRIURETIC PEPTIDE: B Natriuretic Peptide: 49.4 pg/mL (ref 0.0–100.0)

## 2020-12-26 LAB — POC SARS CORONAVIRUS 2 AG -  ED: SARS Coronavirus 2 Ag: NEGATIVE

## 2020-12-26 LAB — TROPONIN I (HIGH SENSITIVITY): Troponin I (High Sensitivity): <2 ng/L (ref <18)

## 2020-12-26 MED ORDER — ALBUTEROL SULFATE HFA 108 (90 BASE) MCG/ACT IN AERS
2.0000 | INHALATION_SPRAY | RESPIRATORY_TRACT | Status: DC | PRN
  Administered 2020-12-26: 2 via RESPIRATORY_TRACT
  Filled 2020-12-26: qty 6.7

## 2020-12-26 MED ORDER — IOHEXOL 350 MG/ML SOLN
100.0000 mL | Freq: Once | INTRAVENOUS | Status: AC | PRN
  Administered 2020-12-26: 100 mL via INTRAVENOUS

## 2020-12-26 NOTE — ED Notes (Signed)
Patient to CT.

## 2020-12-26 NOTE — ED Provider Notes (Signed)
[facility]-EMERGENCY DEPT Provider Note   CSN: [id number] Arrival date & time: 12/26/20  2033     History Chief Complaint  Patient presents with  . Shortness of Breath    Meagan Ramos is a 45 y.o. female.  HPI Patient with a history of cigarette addiction presents with concern of dyspnea. She notes that she has been smoking diminishing amounts recently, but otherwise has had no recent change in health.  She works as a Lawyer, Pharmacologist.  She has had Covid vaccines. Now with past few weeks patient has had worsening dyspnea, cough. Today she had some improvement with albuterol, but with worsening condition she presents for evaluation.  Line no chest pain, no fever, no syncope.    History reviewed. No pertinent past medical history.  Patient Active Problem List   Diagnosis Date Noted  . Cholecystitis 06/03/2020  . Pap smear for cervical cancer screening 01/05/2015  . Chronic cough 12/16/2014  . Chronic left hip pain 12/16/2014  . History of opioid abuse (HCC) 12/16/2014  . Tobacco abuse 12/16/2014  . Family history of thyroid disease 12/16/2014    Past Surgical History:  Procedure Laterality Date  . CHOLECYSTECTOMY N/A 06/04/2020   Procedure: LAPAROSCOPIC CHOLECYSTECTOMY;  Surgeon: Berna Bue, MD;  Location: [facility] OR;  Service: General;  Laterality: N/A;  . MANDIBLE SURGERY    . TUBAL LIGATION       OB History   No obstetric history on file.     Family History  Problem Relation Age of Onset  . Cancer Mother        thyroid cancer   . Hypertension Mother   . Cancer Father        lung cancer   . Hypertension Maternal Grandmother   . Cancer Maternal Grandfather        lung cancer     Social History   Tobacco Use  . Smoking status: Current Every Day Smoker    Packs/day: 0.50    Years: 23.00    Pack years: 11.50  . Smokeless tobacco: Never Used  Substance Use Topics  . Alcohol use: No    Alcohol/week: 0.0 standard drinks  .  Drug use: Not Currently    Home Medications Prior to Admission medications   Medication Sig Start Date End Date Taking? Authorizing Provider  acetaminophen (TYLENOL) 325 MG tablet Take 2 tablets (650 mg total) by mouth every 6 (six) hours as needed for mild pain (or temp > 100). 06/05/20  Yes Trixie Deis R, PA-C  buprenorphine-naloxone (SUBOXONE) 8-2 mg SUBL SL tablet Place 1 tablet under the tongue in the morning, at noon, and at bedtime.   Yes [provider]  clonazePAM (KLONOPIN) 1 MG tablet Take 1 mg by mouth 3 (three) times daily as needed for anxiety.   Yes [provider]    Allergies    Penicillins  Review of Systems   Review of Systems  Constitutional:       Per HPI, otherwise negative  HENT:       Per HPI, otherwise negative  Respiratory:       Per HPI, otherwise negative  Cardiovascular:       Per HPI, otherwise negative  Gastrointestinal: Negative for vomiting.  Endocrine:       Negative aside from HPI  Genitourinary:       Neg aside from HPI   Musculoskeletal:       Per HPI, otherwise negative  Skin: Negative.  Neurological: Negative for syncope.    Physical Exam Updated Vital Signs BP 108/81   Pulse 73   Temp (!) 97.4 F (36.3 C) (Oral)   Resp 14   LMP  (LMP Unknown)   SpO2 97%   Physical Exam Vitals and nursing note reviewed.  Constitutional:      General: She is not in acute distress.    Appearance: She is well-developed and well-nourished.  HENT:     Head: Normocephalic and atraumatic.  Eyes:     Extraocular Movements: EOM normal.     Conjunctiva/sclera: Conjunctivae normal.  Cardiovascular:     Rate and Rhythm: Normal rate and regular rhythm.  Pulmonary:     Effort: Pulmonary effort is normal. No respiratory distress.     Breath sounds: Normal breath sounds. No stridor.  Abdominal:     General: There is no distension.  Musculoskeletal:        General: No edema.  Skin:    General: Skin is warm and dry.   Neurological:     Mental Status: She is alert and oriented to person, place, and time.     Cranial Nerves: No cranial nerve deficit.  Psychiatric:        Mood and Affect: Mood and affect normal.     ED Results / Procedures / Treatments   Labs (all labs ordered are listed, but only abnormal results are displayed) Labs Reviewed  COMPREHENSIVE METABOLIC PANEL - Abnormal; Notable for the following components:      Result Value   Potassium 3.3 (*)    Glucose, Bld 103 (*)    All other components within normal limits  D-DIMER, QUANTITATIVE (NOT AT [facility]) - Abnormal; Notable for the following components:   D-Dimer, Quant 0.56 (*)    All other components within normal limits  CBC WITH DIFFERENTIAL/PLATELET  BRAIN NATRIURETIC PEPTIDE  POC SARS CORONAVIRUS 2 AG -  ED  TROPONIN I (HIGH SENSITIVITY)  TROPONIN I (HIGH SENSITIVITY)    EKG EKG Interpretation  Date/Time:  Tuesday December 26 2020 20:47:25 EST Ventricular Rate:  87 PR Interval:    QRS Duration: 88 QT Interval:  397 QTC Calculation: 481 R Axis:   74 Text Interpretation: Sinus rhythm Borderline repolarization abnormality Baseline wander Artifact ST-t wave abnormality Abnormal ECG Confirmed by Gerhard Munch [phone]) on 12/26/2020 9:45:48 PM   Radiology DG Chest 2 View  Result Date: 12/26/2020 CLINICAL DATA:  Shortness of breath EXAM: CHEST - 2 VIEW COMPARISON:  07/01/2015 FINDINGS: The heart size and mediastinal contours are within normal limits. Both lungs are clear. The visualized skeletal structures are unremarkable. IMPRESSION: No active cardiopulmonary disease. Electronically Signed   By: Jasmine Pang M.D.   On: 12/26/2020 21:10    Procedures Procedures   Medications Ordered in ED Medications  albuterol (VENTOLIN HFA) 108 (90 Base) MCG/ACT inhaler 2 puff (has no administration in time range)  iohexol (OMNIPAQUE) 350 MG/ML injection 100 mL (has no administration in time range)    ED Course  I have reviewed  the triage vital signs and the nursing notes.  Pertinent labs & imaging results that were available during my care of the patient were reviewed by me and considered in my medical decision making (see chart for details).  11:18 PM D-dimer positive, labs otherwise unremarkable, EKG nonischemic, no arrhythmia. Patient remains without hypoxia on room air.  This adult female with a long smoking history presents with ongoing/worsening dyspnea. She is awake, alert, has no chest pain, no fever.  No early evidence for atypical ACS, no evidence for pneumonia, some suspicion for COPD, though the patient does not have this formal diagnosis. Patient's D-dimer is elevated, and she is awaiting CT angiography as well as provision of steroids, albuterol, reassessment.   Dr. Read Drivers is aware of the patient. Final Clinical Impression(s) / ED Diagnoses Final diagnoses:  Shortness of breath     Gerhard Munch, MD 12/26/20 2320

## 2020-12-26 NOTE — ED Triage Notes (Signed)
Pt reports shob for several weeks worsening last night around midnight. Pt sts waking up and checking oxygen saturation and staying 87-90%. 96%ra in triage.

## 2020-12-27 LAB — TROPONIN I (HIGH SENSITIVITY): Troponin I (High Sensitivity): 4 ng/L (ref <18)

## 2020-12-27 MED ORDER — METHYLPREDNISOLONE SODIUM SUCC 125 MG IJ SOLR
125.0000 mg | Freq: Once | INTRAMUSCULAR | Status: AC
  Administered 2020-12-27: 125 mg via INTRAVENOUS
  Filled 2020-12-27: qty 2

## 2020-12-27 MED ORDER — METHYLPREDNISOLONE 4 MG PO TBPK
ORAL_TABLET | ORAL | 0 refills | Status: DC
Start: 2020-12-27 — End: 2022-06-06

## 2020-12-27 NOTE — ED Provider Notes (Signed)
Nursing notes and vitals signs, including pulse oximetry, reviewed.  Summary of this visit's results, reviewed by myself:  EKG:  EKG Interpretation  Date/Time:  Tuesday December 26 2020 20:47:25 EST Ventricular Rate:  87 PR Interval:    QRS Duration: 88 QT Interval:  397 QTC Calculation: 481 R Axis:   74 Text Interpretation: Sinus rhythm Borderline repolarization abnormality Baseline wander Artifact ST-t wave abnormality Abnormal ECG Confirmed by Carmin Muskrat [phone]) on 12/26/2020 9:45:48 PM       Labs:  Results for orders placed or performed during the hospital encounter of 12/26/20 (from the past 24 hour(s))  Comprehensive metabolic panel     Status: Abnormal   Collection Time: 12/26/20  9:53 PM  Result Value Ref Range   Sodium 139 135 - 145 mmol/L   Potassium 3.3 (L) 3.5 - 5.1 mmol/L   Chloride 106 98 - 111 mmol/L   CO2 23 22 - 32 mmol/L   Glucose, Bld 103 (H) 70 - 99 mg/dL   BUN 9 6 - 20 mg/dL   Creatinine, Ser 0.71 0.44 - 1.00 mg/dL   Calcium 9.1 8.9 - 10.3 mg/dL   Total Protein 7.0 6.5 - 8.1 g/dL   Albumin 3.8 3.5 - 5.0 g/dL   AST 21 15 - 41 U/L   ALT 16 0 - 44 U/L   Alkaline Phosphatase 83 38 - 126 U/L   Total Bilirubin 0.5 0.3 - 1.2 mg/dL   GFR, Estimated >60 >60 mL/min   Anion gap 10 5 - 15  Brain natriuretic peptide     Status: None   Collection Time: 12/26/20  9:53 PM  Result Value Ref Range   B Natriuretic Peptide 49.4 0.0 - 100.0 pg/mL  CBC with Differential     Status: None   Collection Time: 12/26/20  9:53 PM  Result Value Ref Range   WBC 10.2 4.0 - 10.5 K/uL   RBC 4.90 3.87 - 5.11 MIL/uL   Hemoglobin 14.4 12.0 - 15.0 g/dL   HCT 42.7 36.0 - 46.0 %   MCV 87.1 80.0 - 100.0 fL   MCH 29.4 26.0 - 34.0 pg   MCHC 33.7 30.0 - 36.0 g/dL   RDW 13.3 11.5 - 15.5 %   Platelets 251 150 - 400 K/uL   nRBC 0.0 0.0 - 0.2 %   Neutrophils Relative % 51 %   Neutro Abs 5.2 1.7 - 7.7 K/uL   Lymphocytes Relative 38 %   Lymphs Abs 3.8 0.7 - 4.0 K/uL   Monocytes  Relative 7 %   Monocytes Absolute 0.7 0.1 - 1.0 K/uL   Eosinophils Relative 3 %   Eosinophils Absolute 0.3 0.0 - 0.5 K/uL   Basophils Relative 1 %   Basophils Absolute 0.1 0.0 - 0.1 K/uL   Immature Granulocytes 0 %   Abs Immature Granulocytes 0.02 0.00 - 0.07 K/uL  D-dimer, quantitative     Status: Abnormal   Collection Time: 12/26/20  9:53 PM  Result Value Ref Range   D-Dimer, Quant 0.56 (H) 0.00 - 0.50 ug/mL-FEU  Troponin I (High Sensitivity)     Status: None   Collection Time: 12/26/20  9:53 PM  Result Value Ref Range   Troponin I (High Sensitivity) <2 <18 ng/L  POC SARS Coronavirus 2 Ag-ED - Nasal Swab (BD Veritor Kit)     Status: None   Collection Time: 12/26/20 10:24 PM  Result Value Ref Range   SARS Coronavirus 2 Ag NEGATIVE NEGATIVE    Imaging Studies: DG  Chest 2 View  Result Date: 12/26/2020 CLINICAL DATA:  Shortness of breath EXAM: CHEST - 2 VIEW COMPARISON:  07/01/2015 FINDINGS: The heart size and mediastinal contours are within normal limits. Both lungs are clear. The visualized skeletal structures are unremarkable. IMPRESSION: No active cardiopulmonary disease. Electronically Signed   By: Donavan Foil M.D.   On: 12/26/2020 21:10   CT Angio Chest PE W and/or Wo Contrast  Result Date: 12/26/2020 CLINICAL DATA:  Worsening shortness of breath. EXAM: CT ANGIOGRAPHY CHEST WITH CONTRAST TECHNIQUE: Multidetector CT imaging of the chest was performed using the standard protocol during bolus administration of intravenous contrast. Multiplanar CT image reconstructions and MIPs were obtained to evaluate the vascular anatomy. CONTRAST:  13m OMNIPAQUE IOHEXOL 350 MG/ML SOLN COMPARISON:  None. FINDINGS: Cardiovascular: Satisfactory opacification of the pulmonary arteries to the segmental level. No evidence of pulmonary embolism. Normal heart size. No pericardial effusion. Mediastinum/Nodes: There is mild right hilar lymphadenopathy. Thyroid gland, trachea, and esophagus demonstrate no  significant findings. Lungs/Pleura: Lungs are clear. No pleural effusion or pneumothorax. Upper Abdomen: Surgical clips are seen within the gallbladder fossa. Musculoskeletal: No chest wall abnormality. No acute or significant osseous findings. Review of the MIP images confirms the above findings. IMPRESSION: 1. No CT evidence of pulmonary embolism. 2. Mild right hilar lymphadenopathy, likely reactive. 3. Evidence of prior cholecystectomy. Electronically Signed   By: TVirgina NorfolkM.D.   On: 12/26/2020 23:32   12:13 AM Patient states she feels better and is ready to go home.  Lungs are clear.     Junie Avilla, JJenny Reichmann MD 12/27/20 [phone]

## 2021-10-11 IMAGING — CT CT ANGIO CHEST
2 of 6 series · 18 of 36 positions shown · IV contrast (omnipaque)
Comparison: None.

CLINICAL DATA: Worsening shortness of breath.

EXAM:
CT ANGIOGRAPHY CHEST WITH CONTRAST
TECHNIQUE: Multidetector CT imaging of the chest was performed using the
standard protocol during bolus administration of intravenous
contrast. Multiplanar CT image reconstructions and MIPs were
obtained to evaluate the vascular anatomy.
CONTRAST:  100mL OMNIPAQUE IOHEXOL 350 MG/ML SOLN

[Series 6: thins · axial · 0.68mm/px · z∈[-282,-16]mm · 17 of 300 slices shown]
[im 17/300  lung]
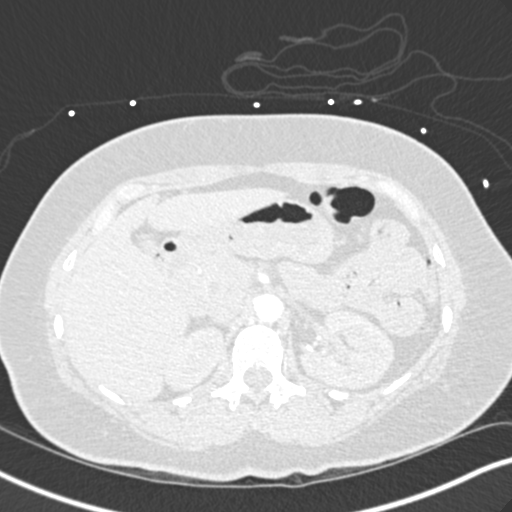
[im 34/300  mediastinal]
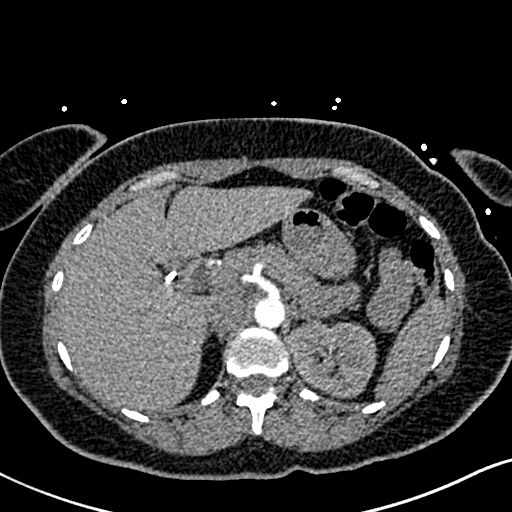
[im 50/300  lung]
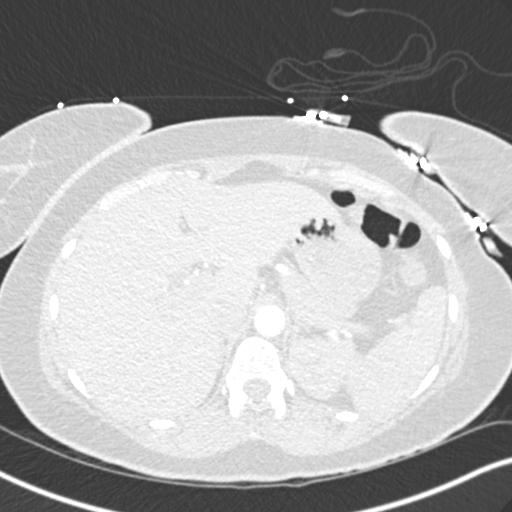
[im 67/300  mediastinal]
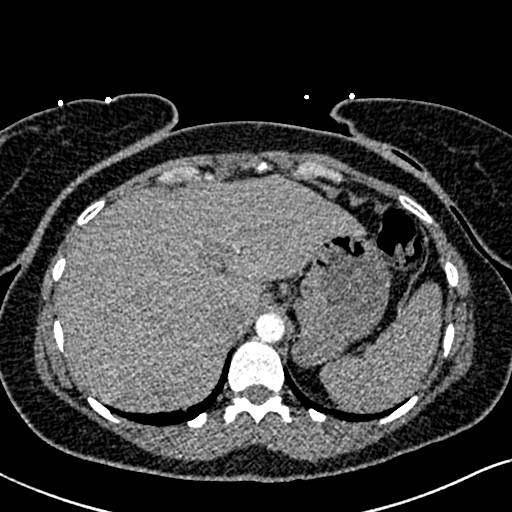
[im 84/300  lung]
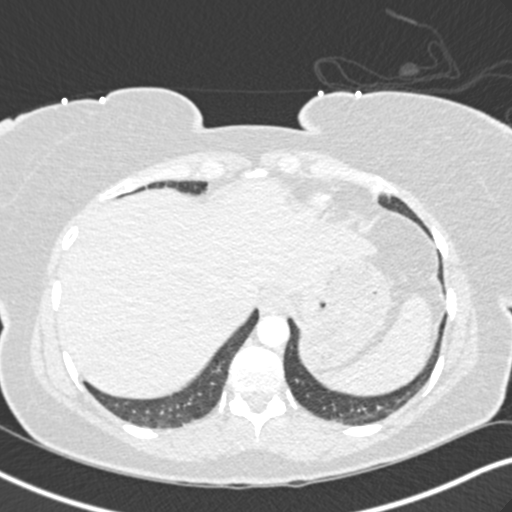
[im 100/300  mediastinal]
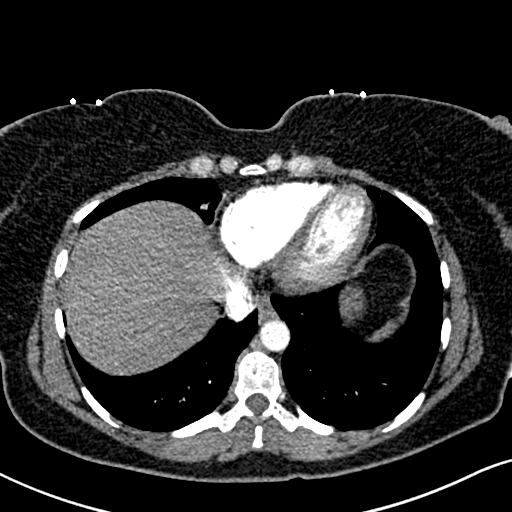
[im 117/300  lung]
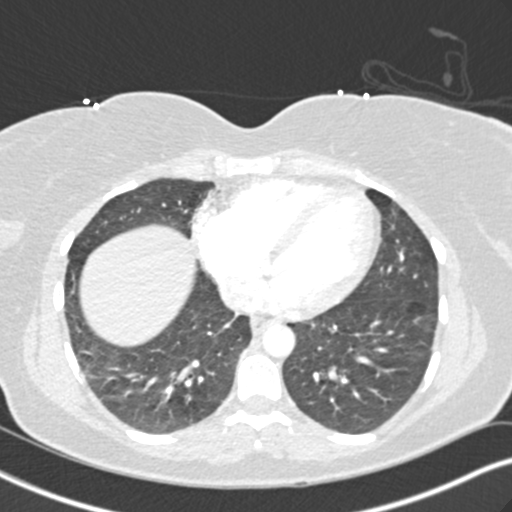
[im 133/300  mediastinal]
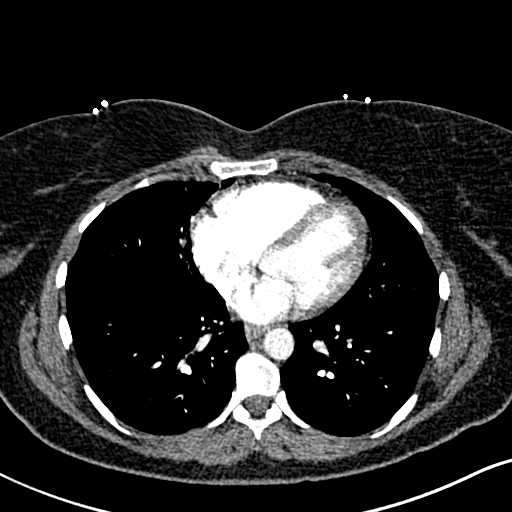
[im 150/300  lung]
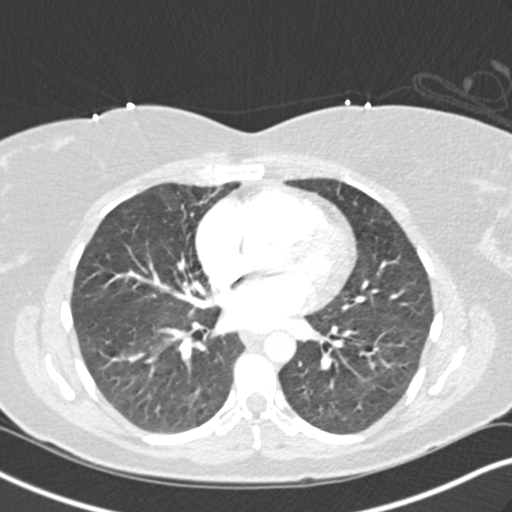
[im 167/300  mediastinal]
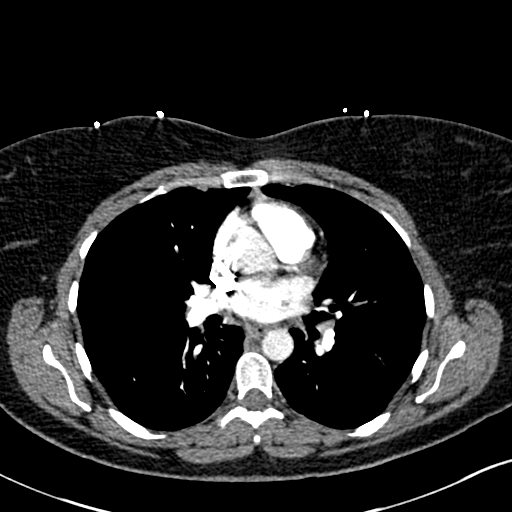
[im 183/300  lung]
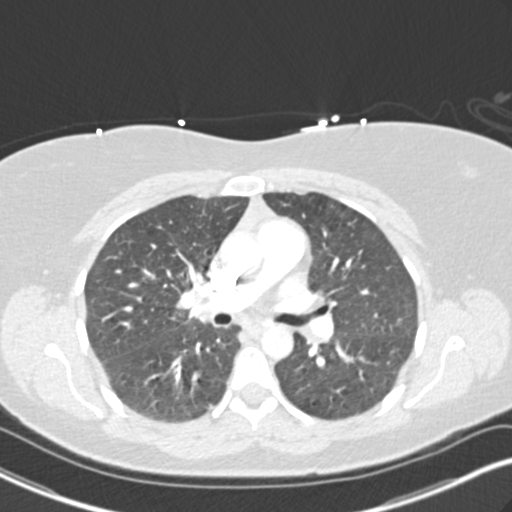
[im 200/300  mediastinal]
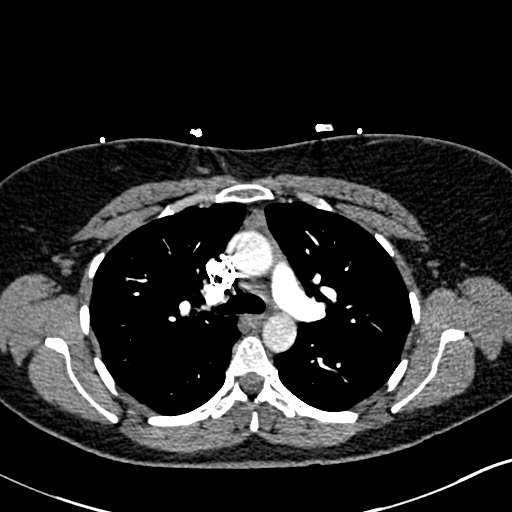
[im 216/300  lung]
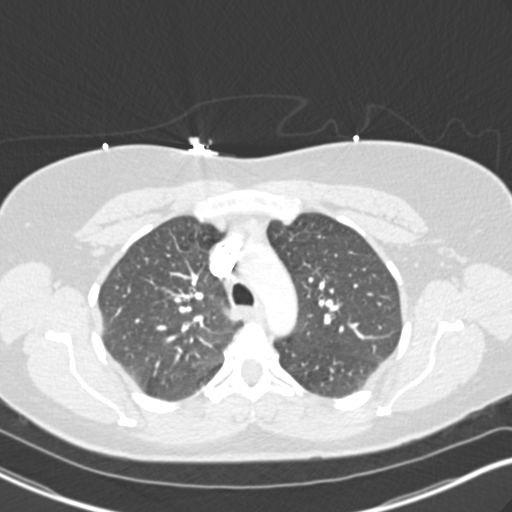
[im 233/300  mediastinal]
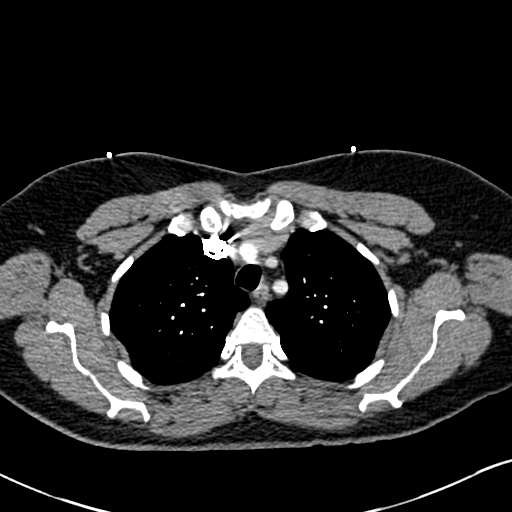
[im 250/300  lung]
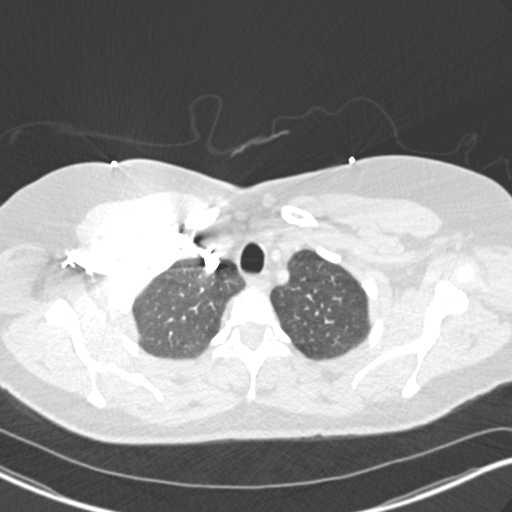
[im 266/300  mediastinal]
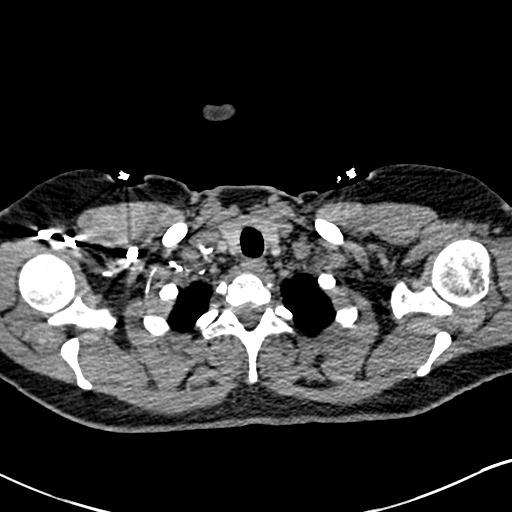
[im 283/300  lung]
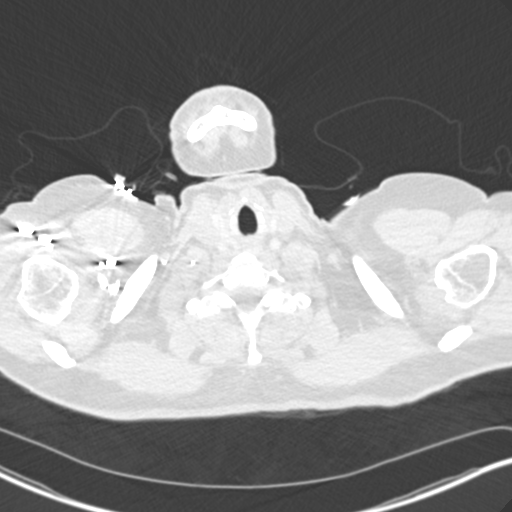

[Series 8: coronal mpr · coronal · 0.60mm/px · 1 of 132 slices shown]
[im 66/132  mediastinal]
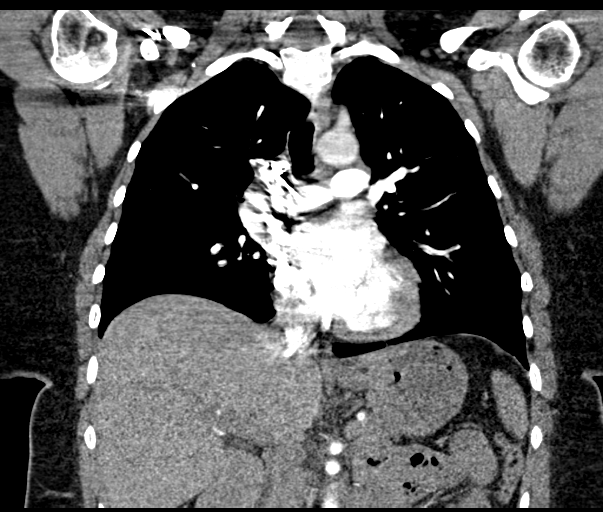

[18 of 36 positions shown; findings below may reference images not displayed]

FINDINGS: Cardiovascular: Satisfactory opacification of the pulmonary arteries
to the segmental level. No evidence of pulmonary embolism. Normal
heart size. No pericardial effusion.

Mediastinum/Nodes: There is mild right hilar lymphadenopathy.
Thyroid gland, trachea, and esophagus demonstrate no significant
findings.

Lungs/Pleura: Lungs are clear. No pleural effusion or pneumothorax.

Upper Abdomen: Surgical clips are seen within the gallbladder fossa.

Musculoskeletal: No chest wall abnormality. No acute or significant
osseous findings.

Review of the MIP images confirms the above findings.
IMPRESSION: 1. No CT evidence of pulmonary embolism.
2. Mild right hilar lymphadenopathy, likely reactive.
3. Evidence of prior cholecystectomy.

## 2021-10-11 IMAGING — CR DG CHEST 2V
2 series · 2 of 2 positions shown · non-contrast
Comparison: 07/01/2015

CLINICAL DATA: Shortness of breath

EXAM:
CHEST - 2 VIEW

[w chest lat]
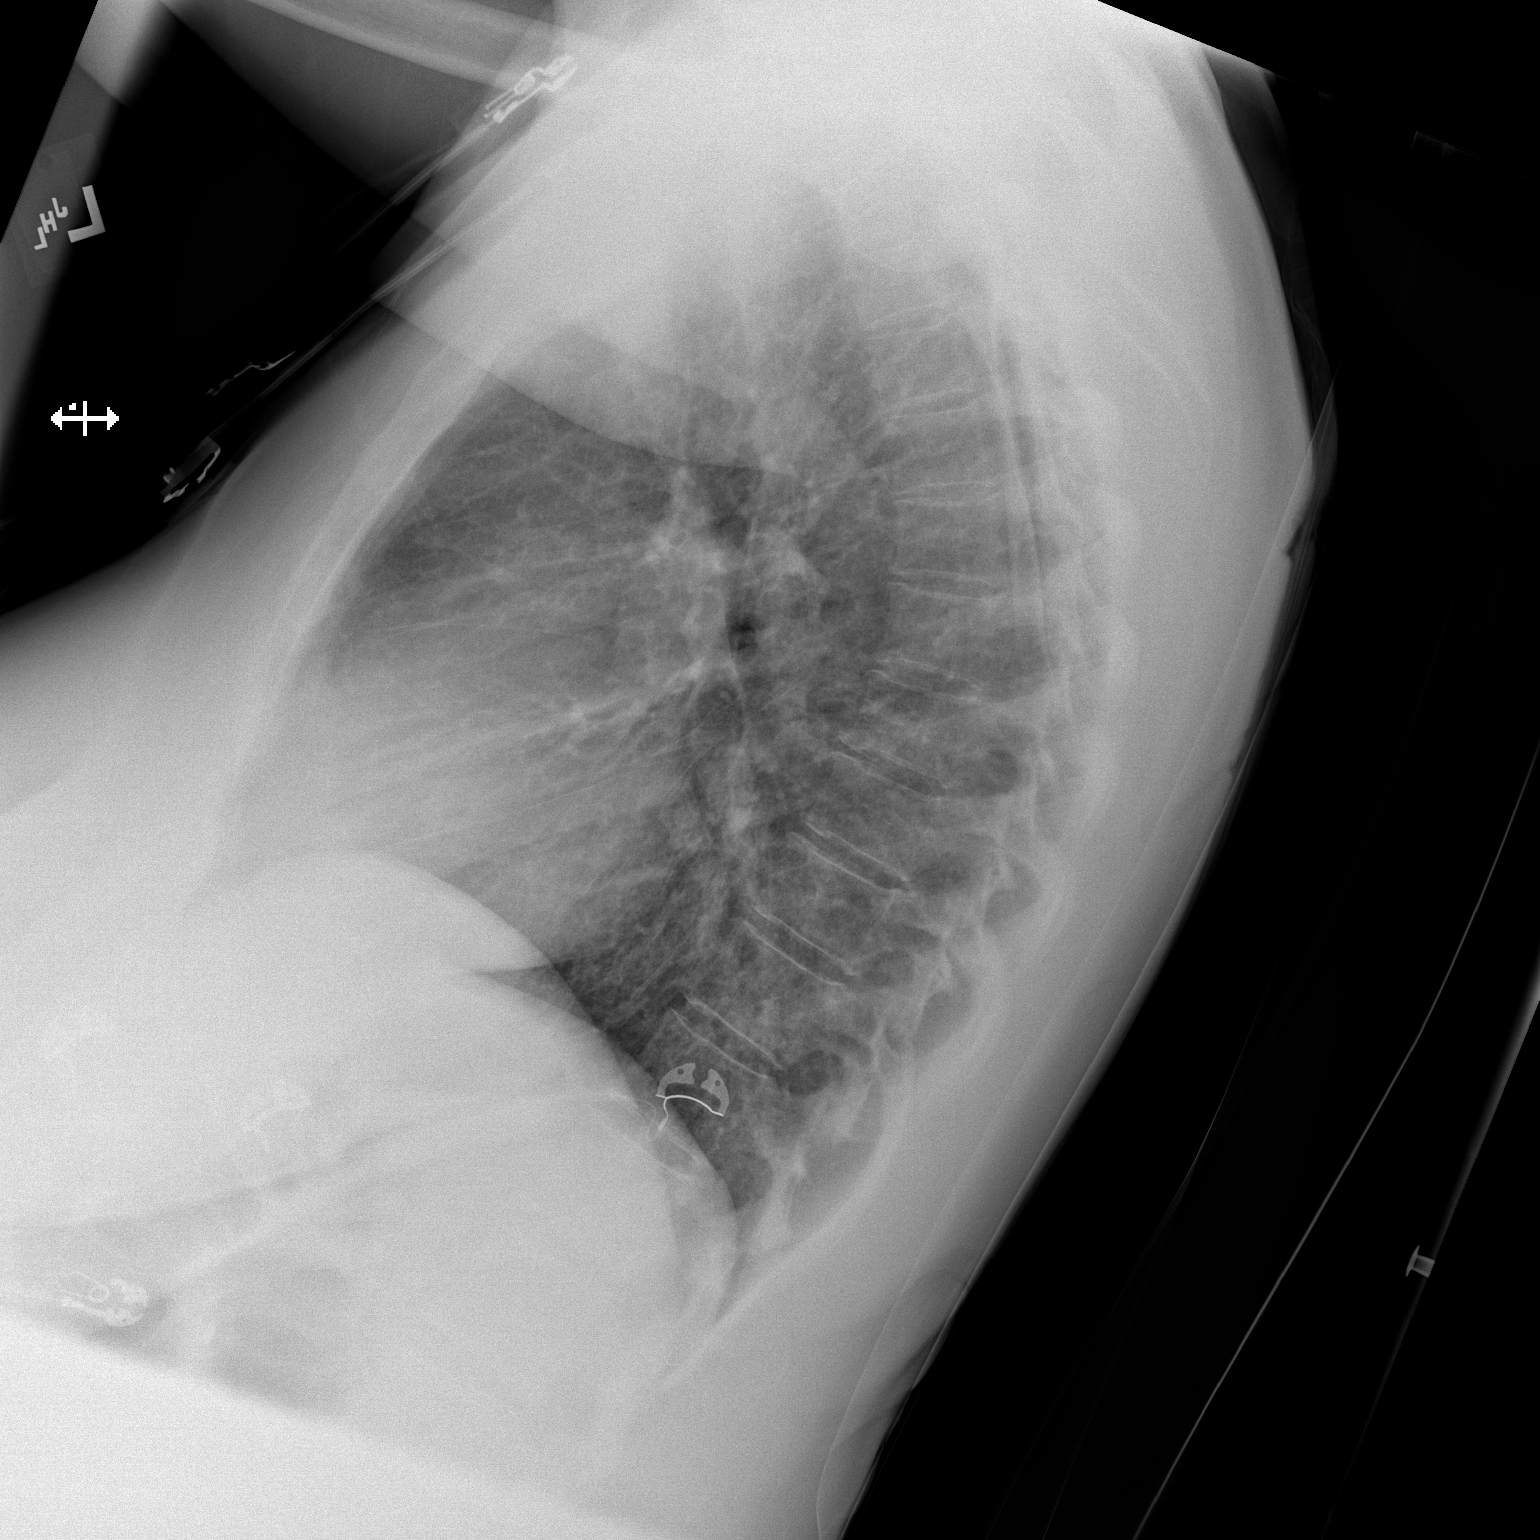

[x chest ap]
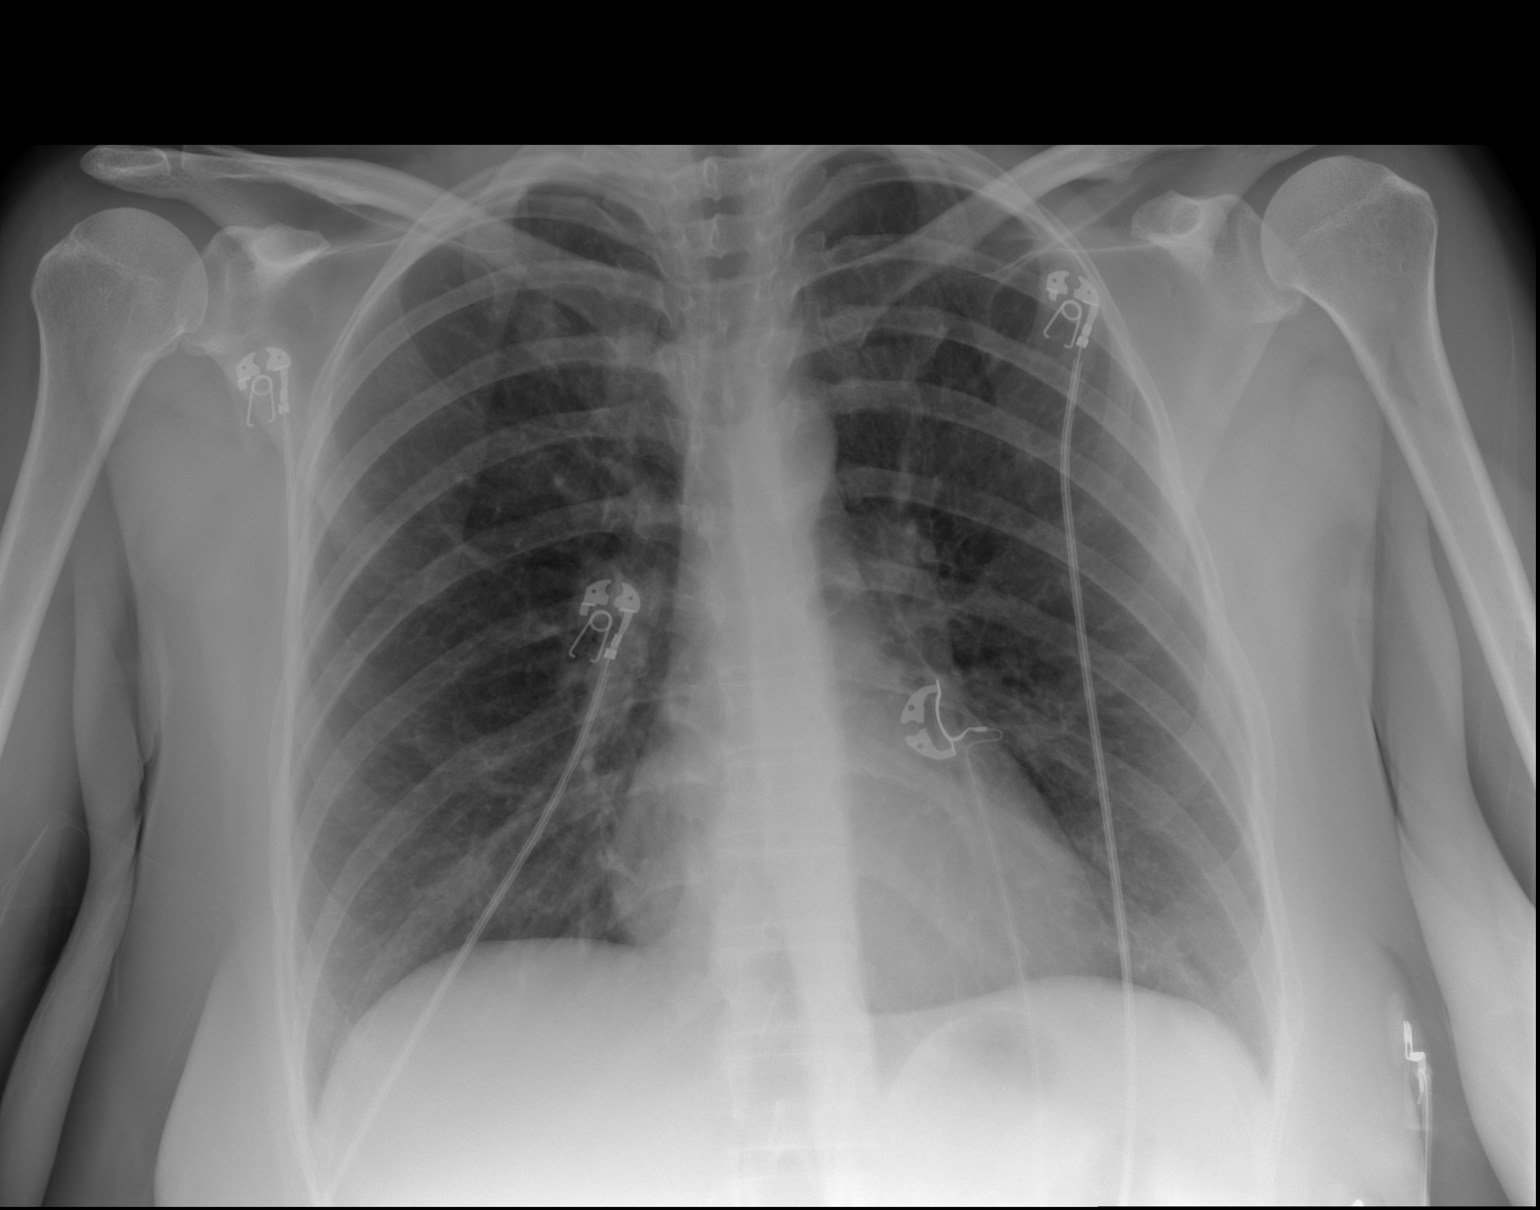

[2 of 2 positions shown; findings below may reference images not displayed]

FINDINGS: The heart size and mediastinal contours are within normal limits.
Both lungs are clear. The visualized skeletal structures are
unremarkable.
IMPRESSION: No active cardiopulmonary disease.

## 2022-04-19 DIAGNOSIS — M25531 Pain in right wrist: Secondary | ICD-10-CM | POA: Insufficient documentation

## 2022-06-06 ENCOUNTER — Encounter: Payer: Self-pay | Admitting: Pulmonary Disease

## 2022-06-06 ENCOUNTER — Other Ambulatory Visit (HOSPITAL_COMMUNITY): Payer: Self-pay

## 2022-06-06 ENCOUNTER — Ambulatory Visit (INDEPENDENT_AMBULATORY_CARE_PROVIDER_SITE_OTHER): Payer: Commercial Managed Care - HMO | Admitting: Pulmonary Disease

## 2022-06-06 ENCOUNTER — Telehealth: Payer: Self-pay | Admitting: Pulmonary Disease

## 2022-06-06 VITALS — BP 122/64 | HR 99 | Ht 62.0 in | Wt 180.0 lb

## 2022-06-06 DIAGNOSIS — R0609 Other forms of dyspnea: Secondary | ICD-10-CM

## 2022-06-06 MED ORDER — FLUTICASONE-SALMETEROL 230-21 MCG/ACT IN AERO: 2.0000 | INHALATION_SPRAY | Freq: Two times a day (BID) | RESPIRATORY_TRACT | 12 refills | Status: DC

## 2022-06-06 MED ORDER — ALBUTEROL SULFATE HFA 108 (90 BASE) MCG/ACT IN AERS
2.0000 | INHALATION_SPRAY | RESPIRATORY_TRACT | 11 refills | Status: DC | PRN
Start: 2022-06-06 — End: 2022-07-22

## 2022-06-06 NOTE — Telephone Encounter (Signed)
Patient is stating that the Adviar HFA is needing a PA to be done.  Can we start on this  And can you run a claim as to why the albuterol can not be filled?  Thank you

## 2022-06-06 NOTE — Progress Notes (Signed)
@Patient  ID: , female    DOB: [birth year], 46 y.o.   MRN: [id number]  Chief Complaint  Patient presents with   Consult    Pt is here for consult for possible copd. Pt states she is unsure if she has copd. Pt states she is unable to catch her breath. Pt is currently on Flovent is not helpful, and albuterol that is helpful     Referring provider: Corrington, Kip A, MD  HPI:   46 y.o. woman whom we have seen in consultation for evaluation of deconditioning, cough.  Note from referring provider reviewed.  Patient reports shortness of breath for about 3 months.  Possibly just worsening over the last 3 months.  There is no dating back to at least 2022 for shortness of breath including ED visit.  That note was also reviewed.  At that time February 2022 chest x-ray on my read interpretation was clear.  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  Shortness of breath stable throughout the day.  Worsening with inclines or stairs.  No times a day with things are better or worse.  No seasonal environmental factors she can identify that make things better or worse.  No position make things better or worse.  Uses albuterol sometimes when she cannot breathe well the inhaler does not work well.  However albuterol nebulizer grandmother's house is quite effective.  Also prescribed Flovent recently.  She does not feel like this helps at all.  No other alleviating or exacerbating factors.   PMH: Tobacco abuse Surgical history: Cholecystectomy, mandible surgery, tubal ligation Family history: Mother with thyroid cancer, hypertension, father with lung cancer Social history: 30-pack-year smoking history, down to half a pack a day more recently with worsening shortness of breath, works as March 2022 / Pulmonary Flowsheets:   ACT:      No data to display           MMRC:     No data to display          Epworth:      No data to display          Tests:   FENO:  No results found for: "NITRICOXIDE"  PFT:     No data to display          WALK:      No data to display          Imaging: Personally reviewed and as per EMR discussion this note No results found.  Lab Results: Personally reviewed CBC    Component Value Date/Time   WBC 10.2 12/26/2020 2153   RBC 4.90 12/26/2020 2153   HGB 14.4 12/26/2020 2153   HCT 42.7 12/26/2020 2153   PLT 251 12/26/2020 2153   MCV 87.1 12/26/2020 2153   MCH 29.4 12/26/2020 2153   MCHC 33.7 12/26/2020 2153   RDW 13.3 12/26/2020 2153   LYMPHSABS 3.8 12/26/2020 2153   MONOABS 0.7 12/26/2020 2153   EOSABS 0.3 12/26/2020 2153   BASOSABS 0.1 12/26/2020 2153    BMET    Component Value Date/Time   NA 139 12/26/2020 2153   K 3.3 (L) 12/26/2020 2153   CL 106 12/26/2020 2153   CO2 23 12/26/2020 2153   GLUCOSE 103 (H) 12/26/2020 2153   BUN 9 12/26/2020 2153   CREATININE 0.71 12/26/2020 2153  CREATININE 0.52 12/16/2014 1034   CALCIUM 9.1 12/26/2020 2153   GFRNONAA >60 12/26/2020 2153   GFRNONAA >89 12/16/2014 1034   GFRAA >60 06/05/2020 0435   GFRAA >89 12/16/2014 1034    BNP    Component Value Date/Time   BNP 49.4 12/26/2020 2153    ProBNP No results found for: "PROBNP"  Specialty Problems       Pulmonary Problems   Chronic cough    Allergies  Allergen Reactions   Penicillins Rash    Childhood allergy     Immunization History  Administered Date(s) Administered   Influenza,inj,Quad PF,6+ Mos 12/16/2014   Pneumococcal Polysaccharide-23 12/16/2014   Tdap 07/01/2015, 12/21/2018    History reviewed. No pertinent past medical history.  Tobacco History: Social History   Tobacco Use  Smoking Status Every Day   Packs/day: 0.50   Years: 23.00   Total pack years: 11.50   Types: Cigarettes  Smokeless Tobacco Never  Tobacco Comments   Pt states  that she smoke 1/2 ppd. ALS 7/27   Ready to quit: Not Answered Counseling given: Not Answered Tobacco comments: Pt states that she smoke 1/2 ppd. ALS 7/27   Continue to not smoke  Outpatient Encounter Medications as of 06/06/2022  Medication Sig   acetaminophen (TYLENOL) 325 MG tablet Take 2 tablets (650 mg total) by mouth every 6 (six) hours as needed for mild pain (or temp > 100).   buprenorphine-naloxone (SUBOXONE) 8-2 mg SUBL SL tablet Place 1 tablet under the tongue in the morning, at noon, and at bedtime.   clonazePAM (KLONOPIN) 1 MG tablet Take 1 mg by mouth 3 (three) times daily as needed for anxiety.   FLOVENT HFA 110 MCG/ACT inhaler Inhale 1 puff into the lungs 2 (two) times daily.   fluticasone-salmeterol (ADVAIR HFA) 230-21 MCG/ACT inhaler Inhale 2 puffs into the lungs 2 (two) times daily.   [DISCONTINUED] albuterol (VENTOLIN HFA) 108 (90 Base) MCG/ACT inhaler Inhale into the lungs.   albuterol (VENTOLIN HFA) 108 (90 Base) MCG/ACT inhaler Inhale 2 puffs into the lungs every 4 (four) hours as needed for wheezing or shortness of breath.   [DISCONTINUED] methylPREDNISolone (MEDROL DOSEPAK) 4 MG TBPK tablet Take tapering dose per package instructions.   No facility-administered encounter medications on file as of 06/06/2022.     Review of Systems  Review of Systems  No chest pain with exertion.  No orthopnea or PND.  No lower extremity swelling.  Competence review of systems otherwise negative. Physical Exam  BP 122/64 (BP Location: Left Arm, Patient Position: Sitting, Cuff Size: Normal)   Pulse 99   Ht 5\' 2"  (1.575 m)   Wt 180 lb (81.6 kg)   SpO2 91%   BMI 32.92 kg/m   Wt Readings from Last 5 Encounters:  06/06/22 180 lb (81.6 kg)  06/03/20 191 lb (86.6 kg)  01/19/15 129 lb (58.5 kg)  01/05/15 129 lb 3.2 oz (58.6 kg)  12/16/14 131 lb 3.2 oz (59.5 kg)    BMI Readings from Last 5 Encounters:  06/06/22 32.92 kg/m  06/03/20 34.93 kg/m  01/19/15 22.85 kg/m      Physical Exam General: Well-appearing, sitting in chair Eyes: EOMI, no icterus Neck: Supple, no JVP Pulmonary: Clear, no wheeze Cardiovascular: Warm, no edema Abdomen: Nondistended, bowel sounds present MSK: No synovitis, no joint effusion Neuro: Normal gait, no weakness Psych: Normal mood, full affect   Assessment & Plan:   Dyspnea on exertion: Over the last few months.  Although EMR indicates it has  been a problem longer than that dating back to at least 2022.  Improved with albuterol nebulized.  Some improved with prednisone.  High suspicion for asthma versus smoking-related lung disease.  PFTs for further evaluation.  Escalate ICS therapy to ICS/LABA therapy via high-dose Advair HFA.  Consider addition of nebulized albuterol in the future.  Cough: Worse in the mornings and evenings.  Suspect related to issues as discussed above.  Chest imaging clear.  ICS/LABA therapy as above.   Return in about 4 weeks (around 07/04/2022).   Karren Burly, MD 06/06/2022   This appointment required 63 minutes of patient care (this includes precharting, chart review, review of results, face-to-face care, etc.).

## 2022-06-06 NOTE — Telephone Encounter (Signed)
Would it be possible for the patient to use the Advair diskus? The Wixela (same med) is $0 copay, but the Advair HFA is non-formulary.  Albuterol was filled as a 25 day supply at the pharmacy on the 10th, so it's too soon, even as a dose change. If the pt was taking it incorrectly the pharmacy likely can call the help desk to get it to go through or find out when it will go through to a paid claim.

## 2022-06-06 NOTE — Patient Instructions (Addendum)
It is nice to meet you  I recommend using a different inhaler.  Use Advair, I requested generic, 2 puffs twice a day.  Rinse your mouth out after every use.  Use every day even if you are feeling better or improved.  If cost is too high, please contact us and we will look for a more cost effective solution.  For further evaluation to help understand why you are short of breath, I recommend pulmonary function test.  We will try to get the scheduled for you to be performed before you see me at our follow-up visit next month.  Hopefully on the same day.  Return to clinic in 4 weeks with PFT prior to visit same day, follow-up with Dr. Judeth Horn

## 2022-06-07 MED ORDER — FLUTICASONE-SALMETEROL 500-50 MCG/ACT IN AEPB: 1.0000 | INHALATION_SPRAY | Freq: Two times a day (BID) | RESPIRATORY_TRACT | 3 refills | Status: DC

## 2022-06-07 NOTE — Telephone Encounter (Signed)
That is fine - Advair diskus 500-50 1 puff BID

## 2022-06-07 NOTE — Telephone Encounter (Signed)
Ordered medication per insurance request. Albuterol can not be filled until next week per pharmacy. Nothing further needed

## 2022-06-11 ENCOUNTER — Other Ambulatory Visit (HOSPITAL_COMMUNITY): Payer: Self-pay

## 2022-06-19 ENCOUNTER — Encounter: Payer: Self-pay | Admitting: Pulmonary Disease

## 2022-06-19 DIAGNOSIS — R0609 Other forms of dyspnea: Secondary | ICD-10-CM

## 2022-06-19 DIAGNOSIS — R053 Chronic cough: Secondary | ICD-10-CM

## 2022-06-19 DIAGNOSIS — B370 Candidal stomatitis: Secondary | ICD-10-CM

## 2022-06-21 MED ORDER — ALBUTEROL SULFATE (2.5 MG/3ML) 0.083% IN NEBU: 2.5000 mg | INHALATION_SOLUTION | RESPIRATORY_TRACT | 12 refills | Status: AC | PRN

## 2022-06-21 MED ORDER — FLUCONAZOLE 50 MG PO TABS: 50.0000 mg | ORAL_TABLET | Freq: Every day | ORAL | 0 refills | Status: AC

## 2022-06-21 MED ORDER — NYSTATIN 100000 UNIT/ML MT SUSP: 5.0000 mL | Freq: Four times a day (QID) | OROMUCOSAL | 0 refills | Status: AC

## 2022-06-21 NOTE — Telephone Encounter (Signed)
Mychart message sent by pt: Meagan Ramos "[name]"  P [facility] (supporting Karren Burly, MD) 3 hours ago (11:18 AM)    Dr.Hunsucker said he was going to send in for a nebulizer am I still getting that?! It would really help     Dr. Judeth Horn, please advise about this for pt.

## 2022-06-21 NOTE — Telephone Encounter (Signed)
Please order DME for nebulizer machine and new prescription for albuterol nebs q4 hrs PRN for wheeze, SOB.

## 2022-06-21 NOTE — Telephone Encounter (Signed)
Dr. Judeth Horn, please advise on pt's email regarding her Advair. Thanks.

## 2022-06-21 NOTE — Addendum Note (Signed)
Addended by: Wyvonne Lenz on: 06/21/2022 03:10 PM   Modules accepted: Orders

## 2022-07-22 ENCOUNTER — Encounter: Payer: Self-pay | Admitting: Pulmonary Disease

## 2022-07-22 ENCOUNTER — Ambulatory Visit (INDEPENDENT_AMBULATORY_CARE_PROVIDER_SITE_OTHER): Payer: Commercial Managed Care - HMO | Admitting: Pulmonary Disease

## 2022-07-22 VITALS — BP 128/66 | HR 92 | Temp 98.2°F | Ht 64.0 in | Wt 178.0 lb

## 2022-07-22 DIAGNOSIS — R0609 Other forms of dyspnea: Secondary | ICD-10-CM

## 2022-07-22 LAB — PULMONARY FUNCTION TEST
DL/VA % pred: 105 %
DL/VA: 4.63 ml/min/mmHg/L
DLCO cor % pred: 109 %
DLCO cor: 23.7 ml/min/mmHg
DLCO unc % pred: 109 %
DLCO unc: 23.7 ml/min/mmHg
FEF 25-75 Post: 3.07 L/sec
FEF 25-75 Pre: 2.54 L/sec
FEF2575-%Change-Post: 21 %
FEF2575-%Pred-Post: 101 %
FEF2575-%Pred-Pre: 83 %
FEV1-%Change-Post: 5 %
FEV1-%Pred-Post: 92 %
FEV1-%Pred-Pre: 87 %
FEV1-Post: 2.73 L
FEV1-Pre: 2.6 L
FEV1FVC-%Change-Post: 0 %
FEV1FVC-%Pred-Pre: 99 %
FEV6-%Change-Post: 5 %
FEV6-%Pred-Post: 94 %
FEV6-%Pred-Pre: 89 %
FEV6-Post: 3.37 L
FEV6-Pre: 3.2 L
FEV6FVC-%Pred-Post: 102 %
FEV6FVC-%Pred-Pre: 102 %
FVC-%Change-Post: 5 %
FVC-%Pred-Post: 92 %
FVC-%Pred-Pre: 87 %
FVC-Post: 3.37 L
FVC-Pre: 3.2 L
Post FEV1/FVC ratio: 81 %
Post FEV6/FVC ratio: 100 %
Pre FEV1/FVC ratio: 81 %
Pre FEV6/FVC Ratio: 100 %
RV % pred: 176 %
RV: 2.9 L
TLC % pred: 121 %
TLC: 6.15 L

## 2022-07-22 MED ORDER — FLUCONAZOLE 100 MG PO TABS: ORAL_TABLET | ORAL | 0 refills | Status: DC

## 2022-07-22 MED ORDER — ALBUTEROL SULFATE HFA 108 (90 BASE) MCG/ACT IN AERS: 2.0000 | INHALATION_SPRAY | RESPIRATORY_TRACT | 11 refills | Status: DC | PRN

## 2022-07-22 NOTE — Progress Notes (Signed)
@Patient  ID: , female    DOB: 06/13/1976, 46 y.o.   MRN: [id number]  Chief Complaint  Patient presents with   Follow-up    Pt is here for follow up for DOE. Pt states that she does have thrush again. Pt did full PFTs done today. Pt states that she feels better. Pt is also on Adviar daily. And albuterol as needed     Referring provider: No ref. provider found  HPI:   46 y.o. woman whom we are seeing in consultation for evaluation of DOE, cough felt to be related to poorly controlled asthma.    Patient for asthma last visit based on symptoms as well as hyperinflation on chest x-ray.  Placed on high-dose Advair.  She reports initial great response over the first 2 to 4 weeks.  Cough nearly resolved.  Dyspnea improved.  Unfortunately symptoms have waxed and waned in the interim.  We had a telephone encounter with prescribed albuterol nebulizer as well as fluconazole for thrush.  This helped quite a bit.  However thrush has returned.  She continues albuterol HFA and nebulizer.  Usually 2 times a day.  Sometimes less, sometimes more.  She does endorse that likely environmental triggers including pet dander and hair, dust etc. 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  We discussed is essentially impossible to avoid these triggers given her occupation.  PFTs performed today.  These were personally reviewed and interpreted as below but these do show hyperinflation and air trapping indicative of small airways disease with preserved DLCO.  HPI at initial visit: Patient reports shortness of breath for about 3 months.  Possibly just worsening over the last 3 months.  There is no dating back to at least 2022 for shortness of breath including ED visit.  That note was also reviewed.  At that time February 2022 chest x-ray on my read interpretation was clear.  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  Shortness of breath stable throughout the day.  Worsening with inclines or stairs.  No times a day with things are better or worse.  No seasonal environmental factors she can identify that make things better or worse.  No position make things better or worse.  Uses albuterol sometimes when she cannot breathe well the inhaler does not work well.  However albuterol nebulizer grandmother's house is quite effective.  Also prescribed Flovent recently.  She does not feel like this helps at all.  No other alleviating or exacerbating factors.   PMH: Tobacco abuse Surgical history: Cholecystectomy, mandible surgery, tubal ligation Family history: Mother with thyroid cancer, hypertension, father with lung cancer Social history: 30-pack-year smoking history, down to half a pack a day more recently with worsening shortness of breath, works as March 2022 / Pulmonary Flowsheets:   ACT:      No data to display           MMRC:     No data to display           Epworth:      No data to display           Tests:   FENO:  No results found for: "NITRICOXIDE"  PFT:    Latest Ref Rng & Units 07/22/2022   10:53 AM  PFT Results  FVC-Pre L 3.20  P  FVC-Predicted Pre % 87  P  FVC-Post L 3.37  P  FVC-Predicted Post % 92  P  Pre FEV1/FVC % % 81  P  Post FEV1/FCV % % 81  P  FEV1-Pre L 2.60  P  FEV1-Predicted Pre % 87  P  FEV1-Post L 2.73  P  DLCO uncorrected ml/min/mmHg 23.70  P  DLCO UNC% % 109  P  DLCO corrected ml/min/mmHg 23.70  P  DLCO COR %Predicted % 109  P  DLVA Predicted % 105  P  TLC L 6.15  P  TLC % Predicted % 121  P  RV % Predicted % 176  P    P Preliminary result   Personally reviewed and interpreted as normal spirometry without significant bronchodilator response.  Lung volumes indicate hyperinflation and air trapping.  DLCO within normal limits.  WALK:      No data to display            Imaging: Personally reviewed and as per EMR discussion this note No results found.  Lab Results: Personally reviewed CBC    Component Value Date/Time   WBC 10.2 12/26/2020 2153   RBC 4.90 12/26/2020 2153   HGB 14.4 12/26/2020 2153   HCT 42.7 12/26/2020 2153   PLT 251 12/26/2020 2153   MCV 87.1 12/26/2020 2153   MCH 29.4 12/26/2020 2153   MCHC 33.7 12/26/2020 2153   RDW 13.3 12/26/2020 2153   LYMPHSABS 3.8 12/26/2020 2153   MONOABS 0.7 12/26/2020 2153   EOSABS 0.3 12/26/2020 2153   BASOSABS 0.1 12/26/2020 2153    BMET    Component Value Date/Time   NA 139 12/26/2020 2153   K 3.3 (L) 12/26/2020 2153   CL 106 12/26/2020 2153   CO2 23 12/26/2020 2153   GLUCOSE 103 (H) 12/26/2020 2153   BUN 9 12/26/2020 2153   CREATININE 0.71 12/26/2020 2153   CREATININE 0.52 12/16/2014 1034   CALCIUM 9.1 12/26/2020 2153   GFRNONAA >60 12/26/2020 2153   GFRNONAA >89 12/16/2014 1034   GFRAA >60 06/05/2020 0435   GFRAA >89 12/16/2014 1034    BNP    Component Value Date/Time   BNP 49.4 12/26/2020 2153    ProBNP No results found for: "PROBNP"  Specialty Problems       Pulmonary Problems   Chronic cough    Allergies  Allergen Reactions   Penicillins Rash    Childhood allergy     Immunization History  Administered Date(s) Administered   Influenza,inj,Quad PF,6+ Mos 12/16/2014   Pneumococcal Polysaccharide-23 12/16/2014   Tdap 07/01/2015, 12/21/2018    History reviewed. No pertinent past medical history.  Tobacco History: Social History   Tobacco Use  Smoking Status Every Day   Packs/day: 0.50   Years: 23.00   Total pack years: 11.50   Types: Cigarettes  Smokeless Tobacco Never  Tobacco Comments   Pt states that she smoke 1/2 ppd. ALS 7/27   Ready to quit: Not Answered Counseling given: Not Answered Tobacco comments: Pt states that she smoke 1/2 ppd. ALS 7/27   Continue to not smoke  Outpatient Encounter Medications as of 07/22/2022  Medication  Sig   acetaminophen (TYLENOL) 325 MG tablet Take 2 tablets (650 mg total) by mouth every 6 (six) hours as needed for mild pain (or temp > 100).   albuterol (PROVENTIL) (2.5 MG/3ML) 0.083% nebulizer solution Take 3 mLs (2.5 mg total) by nebulization every 4 (four) hours as needed for wheezing or shortness of breath.  buprenorphine-naloxone (SUBOXONE) 8-2 mg SUBL SL tablet Place 1 tablet under the tongue in the morning, at noon, and at bedtime.   clonazePAM (KLONOPIN) 1 MG tablet Take 1 mg by mouth 3 (three) times daily as needed for anxiety.   FLOVENT HFA 110 MCG/ACT inhaler Inhale 1 puff into the lungs 2 (two) times daily.   fluconazole (DIFLUCAN) 100 MG tablet Take 2 tablets on day 1, 1 tablet on days 2 through 7.   fluticasone-salmeterol (ADVAIR DISKUS) 500-50 MCG/ACT AEPB Inhale 1 puff into the lungs in the morning and at bedtime.   [DISCONTINUED] albuterol (VENTOLIN HFA) 108 (90 Base) MCG/ACT inhaler Inhale 2 puffs into the lungs every 4 (four) hours as needed for wheezing or shortness of breath.   albuterol (VENTOLIN HFA) 108 (90 Base) MCG/ACT inhaler Inhale 2 puffs into the lungs every 4 (four) hours as needed for wheezing or shortness of breath.   [DISCONTINUED] fluticasone-salmeterol (ADVAIR HFA) 230-21 MCG/ACT inhaler Inhale 2 puffs into the lungs 2 (two) times daily.   No facility-administered encounter medications on file as of 07/22/2022.     Review of Systems  Review of Systems  N/a  Physical Exam  BP 128/66 (BP Location: Left Arm, Patient Position: Sitting, Cuff Size: Normal)   Pulse 92   Temp 98.2 F (36.8 C) (Oral)   Ht 5\' 4"  (1.626 m)   Wt 178 lb (80.7 kg)   SpO2 92%   BMI 30.55 kg/m   Wt Readings from Last 5 Encounters:  07/22/22 178 lb (80.7 kg)  06/06/22 180 lb (81.6 kg)  06/03/20 191 lb (86.6 kg)  01/19/15 129 lb (58.5 kg)  01/05/15 129 lb 3.2 oz (58.6 kg)    BMI Readings from Last 5 Encounters:  07/22/22 30.55 kg/m  06/06/22 32.92 kg/m  06/03/20  34.93 kg/m  01/19/15 22.85 kg/m     Physical Exam General: Well-appearing, sitting in chair Eyes: EOMI, no icterus Neck: Supple, no JVP Pulmonary: Clear, no wheeze Cardiovascular: Warm, no edema Abdomen: Nondistended, bowel sounds present MSK: No synovitis, no joint effusion Neuro: Normal gait, no weakness Psych: Normal mood, full affect   Assessment & Plan:   Dyspnea on exertion and cough largely related to asthma:  EMR indicates it has been a problem dating back to at least 2022.  Improved with albuterol nebulized.  Some improved with prednisone.  Further temporary improvement with high-dose ICS/LABA therapy via Advair discus.  PFTs confirmed presence of hyperinflation and air trapping.  Escalate to triple inhaled therapy via Trelegy given at least twice daily albuterol usage despite aggressive ICS/LABA therapy.  Today for Tezspire to start process of biologic therapy given severe symptoms despite aggressive ICS/LABA therapy.  If we are fortunate and symptoms improved with triple inhaled therapy we do not need to pursue this.   Return in about 3 months (around 10/21/2022).   14/09/2022, MD 07/22/2022

## 2022-07-22 NOTE — Patient Instructions (Signed)
Full PFT Performed Today  

## 2022-07-22 NOTE — Progress Notes (Signed)
Full PFT Performed Today  

## 2022-07-22 NOTE — Patient Instructions (Signed)
Nice to see you again  Take fluconazole 2 tablets on day 1 then 1 tablet on days 2 through 7 for thrush  Take Trelegy 1 puff once daily.  Stop Advair once you start this.  Rinse your mouth out with water after every use.  Continue to albuterol inhaler 2 puffs as needed via the spacer provided.  Also continue use the albuterol nebulizer as needed.  Please let me know if this is helping or symptoms are still severe.  We will do paperwork today for medication called Tezspire if symptoms still are so severe despite increasing the inhaler.  This is a injection medicine.  The first dose will be given in the office.  Subsequent injections can be performed at home.  Return to clinic in 3 months or sooner as needed with Dr. Judeth Horn.

## 2022-08-19 DIAGNOSIS — M7710 Lateral epicondylitis, unspecified elbow: Secondary | ICD-10-CM | POA: Insufficient documentation

## 2022-08-20 DIAGNOSIS — M25521 Pain in right elbow: Secondary | ICD-10-CM | POA: Insufficient documentation

## 2022-09-06 ENCOUNTER — Telehealth: Payer: Self-pay | Admitting: Pulmonary Disease

## 2022-09-06 NOTE — Telephone Encounter (Signed)
Advise she go back to the Advair she was on before - it seems her symptoms are worse on Trelegy.

## 2022-09-06 NOTE — Telephone Encounter (Signed)
Pt called the office stating that the Trelegy inhaler has not been working well for her. States she is having to do at least two neb treatments and also has had to use her rescue inhaler at least three times a day even after using the Trelegy in the mornings.  Due to this, pt wants to know what we might be able to recommend.  Dr. Silas Flood, please advise. Pt is aware that he is not available until Monday 10/30.

## 2022-09-06 NOTE — Telephone Encounter (Signed)
Called and spoke with pt letting her know info per MH and she verbalized understanding. Nothing further needed. 

## 2022-10-15 DIAGNOSIS — M7700 Medial epicondylitis, unspecified elbow: Secondary | ICD-10-CM | POA: Insufficient documentation

## 2022-10-16 ENCOUNTER — Ambulatory Visit (INDEPENDENT_AMBULATORY_CARE_PROVIDER_SITE_OTHER): Payer: Commercial Managed Care - HMO | Admitting: Pulmonary Disease

## 2022-10-16 ENCOUNTER — Encounter: Payer: Self-pay | Admitting: Pulmonary Disease

## 2022-10-16 VITALS — BP 130/68 | HR 87 | Temp 98.9°F | Wt 185.6 lb

## 2022-10-16 DIAGNOSIS — J4550 Severe persistent asthma, uncomplicated: Secondary | ICD-10-CM | POA: Diagnosis not present

## 2022-10-16 MED ORDER — TRELEGY ELLIPTA 200-62.5-25 MCG/ACT IN AEPB: 1.0000 | INHALATION_SPRAY | Freq: Every day | RESPIRATORY_TRACT | 0 refills | Status: DC

## 2022-10-16 NOTE — Progress Notes (Signed)
@Patient  ID: , female    DOB: 08-Jul-1976, 46 y.o.   MRN: [id number]  Chief Complaint  Patient presents with   Follow-up    Pt is here for follo wup for DOE. Pt is needing paperwork for Trelegy. She states that it really did help before but she is out. Pt is also out of Albuterol and is needing refills.     Referring provider: No ref. provider found  HPI:   46 y.o. woman whom we are seeing in follow up for evaluation of DOE, cough felt to be related to poorly controlled asthma.    Overall doing okay.  At last visit was escalated from high-dose Advair to Trelegy.  She thought this has helped.  More so in the first week or so.  Less helpful but certainly better than Advair.  Still using rescue inhaler/nebulizer a couple times a day.  Slight improvement from prior.  Still with frequent albuterol use.  Discussed role and rationale of biologic therapy given refractory symptoms.  Paperwork for 59 today given eosinophils elevated 300 in 2022.   HPI at initial visit: Patient reports shortness of breath for about 3 months.  Possibly just worsening over the last 3 months.  There is no dating back to at least 2022 for shortness of breath including ED visit.  That note was also reviewed.  At that time February 2022 chest x-ray on my read interpretation was clear.  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  Shortness of breath stable throughout the day.  Worsening with inclines or stairs.  No times a day with things are better or worse.  No seasonal environmental factors she can identify that make things better or worse.  No position make things better or worse.  Uses albuterol sometimes when she cannot breathe well the inhaler does not work well.  However albuterol nebulizer grandmother's house is quite effective.  Also prescribed Flovent recently.  She does not feel like  this helps at all.  No other alleviating or exacerbating factors.   PMH: Tobacco abuse Surgical history: Cholecystectomy, mandible surgery, tubal ligation Family history: Mother with thyroid cancer, hypertension, father with lung cancer Social history: 30-pack-year smoking history, down to half a pack a day more recently with worsening shortness of breath, works as March 2022 / Pulmonary Flowsheets:   ACT:      No data to display           MMRC:     No data to display           Epworth:      No data to display           Tests:   FENO:  No results found for: "NITRICOXIDE"  PFT:    Latest Ref Rng & Units 07/22/2022   10:53 AM  PFT Results  FVC-Pre L 3.20   FVC-Predicted Pre % 87   FVC-Post L 3.37   FVC-Predicted Post % 92   Pre FEV1/FVC % % 81   Post FEV1/FCV % % 81   FEV1-Pre L 2.60   FEV1-Predicted Pre % 87   FEV1-Post L 2.73   DLCO uncorrected ml/min/mmHg 23.70   DLCO UNC% % 109   DLCO corrected ml/min/mmHg 23.70   DLCO COR %Predicted % 109   DLVA Predicted % 105   TLC L 6.15  TLC % Predicted % 121   RV % Predicted % 176   Personally reviewed and interpreted as normal spirometry without significant bronchodilator response.  Lung volumes indicate hyperinflation and air trapping.  DLCO within normal limits.  WALK:      No data to display           Imaging: Personally reviewed and as per EMR discussion this note No results found.  Lab Results: Personally reviewed CBC    Component Value Date/Time   WBC 10.2 12/26/2020 2153   RBC 4.90 12/26/2020 2153   HGB 14.4 12/26/2020 2153   HCT 42.7 12/26/2020 2153   PLT 251 12/26/2020 2153   MCV 87.1 12/26/2020 2153   MCH 29.4 12/26/2020 2153   MCHC 33.7 12/26/2020 2153   RDW 13.3 12/26/2020 2153   LYMPHSABS 3.8 12/26/2020 2153   MONOABS 0.7 12/26/2020 2153   EOSABS 0.3 12/26/2020 2153   BASOSABS 0.1 12/26/2020 2153    BMET    Component Value Date/Time    NA 139 12/26/2020 2153   K 3.3 (L) 12/26/2020 2153   CL 106 12/26/2020 2153   CO2 23 12/26/2020 2153   GLUCOSE 103 (H) 12/26/2020 2153   BUN 9 12/26/2020 2153   CREATININE 0.71 12/26/2020 2153   CREATININE 0.52 12/16/2014 1034   CALCIUM 9.1 12/26/2020 2153   GFRNONAA >60 12/26/2020 2153   GFRNONAA >89 12/16/2014 1034   GFRAA >60 06/05/2020 0435   GFRAA >89 12/16/2014 1034    BNP    Component Value Date/Time   BNP 49.4 12/26/2020 2153    ProBNP No results found for: "PROBNP"  Specialty Problems       Pulmonary Problems   Chronic cough    Allergies  Allergen Reactions   Penicillins Rash    Childhood allergy     Immunization History  Administered Date(s) Administered   Influenza Inj Mdck Quad Pf 09/27/2021   Influenza,inj,Quad PF,6+ Mos 12/16/2014   Pneumococcal Polysaccharide-23 12/16/2014   Tdap 07/01/2015, 12/21/2018    History reviewed. No pertinent past medical history.  Tobacco History: Social History   Tobacco Use  Smoking Status Every Day   Packs/day: 0.50   Years: 23.00   Total pack years: 11.50   Types: Cigarettes  Smokeless Tobacco Never  Tobacco Comments   Pt states that she smoke 1/2 ppd. ALS 7/27   Ready to quit: Not Answered Counseling given: Not Answered Tobacco comments: Pt states that she smoke 1/2 ppd. ALS 7/27   Continue to not smoke  Outpatient Encounter Medications as of 10/16/2022  Medication Sig   albuterol (PROVENTIL) (2.5 MG/3ML) 0.083% nebulizer solution Take 3 mLs (2.5 mg total) by nebulization every 4 (four) hours as needed for wheezing or shortness of breath.   albuterol (VENTOLIN HFA) 108 (90 Base) MCG/ACT inhaler Inhale 2 puffs into the lungs every 4 (four) hours as needed for wheezing or shortness of breath.   buprenorphine-naloxone (SUBOXONE) 8-2 mg SUBL SL tablet Place 1 tablet under the tongue in the morning, at noon, and at bedtime.   clonazePAM (KLONOPIN) 1 MG tablet Take 1 mg by mouth 3 (three) times daily  as needed for anxiety.   FLOVENT HFA 110 MCG/ACT inhaler Inhale 1 puff into the lungs 2 (two) times daily.   fluconazole (DIFLUCAN) 100 MG tablet Take 2 tablets on day 1, 1 tablet on days 2 through 7.   Fluticasone-Umeclidin-Vilant (TRELEGY ELLIPTA) 200-62.5-25 MCG/ACT AEPB Inhale 1 Inhalation into the lungs daily.   fluticasone-salmeterol (ADVAIR DISKUS) 500-50  MCG/ACT AEPB Inhale 1 puff into the lungs in the morning and at bedtime. (Patient not taking: Reported on 10/16/2022)   [DISCONTINUED] acetaminophen (TYLENOL) 325 MG tablet Take 2 tablets (650 mg total) by mouth every 6 (six) hours as needed for mild pain (or temp > 100).   No facility-administered encounter medications on file as of 10/16/2022.     Review of Systems  Review of Systems  N/a  Physical Exam  BP 130/68 (BP Location: Left Arm, Patient Position: Sitting, Cuff Size: Normal)   Pulse 87   Temp 98.9 F (37.2 C) (Oral)   Wt 185 lb 9.6 oz (84.2 kg)   SpO2 96%   BMI 31.86 kg/m   Wt Readings from Last 5 Encounters:  10/16/22 185 lb 9.6 oz (84.2 kg)  07/22/22 178 lb (80.7 kg)  06/06/22 180 lb (81.6 kg)  06/03/20 191 lb (86.6 kg)  01/19/15 129 lb (58.5 kg)    BMI Readings from Last 5 Encounters:  10/16/22 31.86 kg/m  07/22/22 30.55 kg/m  06/06/22 32.92 kg/m  06/03/20 34.93 kg/m  01/19/15 22.85 kg/m     Physical Exam General: Well-appearing, sitting in chair Eyes: EOMI, no icterus Neck: Supple, no JVP Pulmonary: Clear, no wheeze Cardiovascular: Warm, no edema Abdomen: Nondistended, bowel sounds present MSK: No synovitis, no joint effusion Neuro: Normal gait, no weakness Psych: Normal mood, full affect   Assessment & Plan:   Dyspnea on exertion and cough largely related to asthma:  EMR indicates it has been a problem dating back to at least 2022.  Improved with albuterol nebulized.  Some improved with prednisone.  Further temporary improvement with high-dose ICS/LABA therapy via Advair discus.  PFTs  confirmed presence of hyperinflation and air trapping.  Further improvement with high-dose Trelegy.  However with frequent rescue Saba use.  Eosinophils 300 2022.  Nucala paperwork today.  Samples of Trelegy.  She states she just got full Medicaid, this is not covered in 2023.  If not covered in 2024, will need to resume high-dose Advair and add Spiriva Respimat.  Initiation of Nucala will require high intensity drug monitoring.  Return in about 3 months (around 01/15/2023).   Karren Burly, MD 10/16/2022

## 2022-10-16 NOTE — Patient Instructions (Signed)
Nice to see you again  We have some samples of Trelegy you can use this  We can fill out manufacturing assistance paperwork to get this for you moving forward  If not, we can stick with Advair and add an additional inhaler called Spiriva to replicate what is in Trelegy   We will fill out paper for Nucala today.  This is a shot or injection to help better control asthma symptoms when the inhaler not working well.  Return: 3 months or sooner if needed with Dr. Judeth Horn

## 2022-11-06 ENCOUNTER — Other Ambulatory Visit: Payer: Self-pay | Admitting: Family Medicine

## 2022-11-06 DIAGNOSIS — Z1231 Encounter for screening mammogram for malignant neoplasm of breast: Secondary | ICD-10-CM

## 2022-11-07 ENCOUNTER — Telehealth: Payer: Self-pay | Admitting: Pulmonary Disease

## 2022-11-08 ENCOUNTER — Other Ambulatory Visit (HOSPITAL_COMMUNITY): Payer: Self-pay

## 2022-11-08 ENCOUNTER — Telehealth: Payer: Self-pay | Admitting: Pharmacist

## 2022-11-08 DIAGNOSIS — J4550 Severe persistent asthma, uncomplicated: Secondary | ICD-10-CM

## 2022-11-08 MED ORDER — NUCALA 100 MG/ML ~~LOC~~ SOAJ
100.0000 mg | SUBCUTANEOUS | 0 refills | Status: DC
  Filled 2022-11-08 (×2): qty 1, 28d supply, fill #0

## 2022-11-08 MED ORDER — NYSTATIN 100000 UNIT/ML MT SUSP: 5.0000 mL | Freq: Four times a day (QID) | OROMUCOSAL | 0 refills | Status: AC

## 2022-11-08 MED ORDER — FLUCONAZOLE 100 MG PO TABS: ORAL_TABLET | ORAL | 0 refills | Status: DC

## 2022-11-08 NOTE — Telephone Encounter (Signed)
Called and spoke to patient and let her know I was sending the nystatin mouth wash and the pill in for her. And that I have the paperwork for Nucala in MH red folder for him to sign when he returns to office and we will get it processed. Nothing further needed

## 2022-11-08 NOTE — Telephone Encounter (Signed)
Delivery instructions have been updated in [place], medication will be couriered to [facility] by 11/13/22.  Rx adjudication deferred per new process at [facility]. Will need to verify rx is being correctly processed on 1/2 or the morning of 1/3.

## 2022-11-08 NOTE — Telephone Encounter (Signed)
Called patient and she states that she has thrush again and [facility] normally sends in the mouth wash and the pill. She is asking if this can be sent in again.   I also updated her that I would find out an update for her Nucala shot.  Please advise Beth since [facility] is out of office for the thrush medication.

## 2022-11-08 NOTE — Telephone Encounter (Signed)
Fine to send in Nystain s/s QID

## 2022-11-08 NOTE — Telephone Encounter (Signed)
Received notification from [employer] regarding a prior authorization for NUCALA. Authorization has been APPROVED from 11/08/22 to 11/08/2023.   Per test claim, copay for 28 days supply is $0 when [employer] plan is run as primary and Medicaid plan is processed as secondary  Patient can fill through [facility]: [phone]   Authorization # [id number]  ATC patient to discuss. Unable to reach and unable to leave VM since VM box is full. Will need to confirm no change to insurance with new year. Rx sent to [facility] today to be courired to clinic  Will continue to f/u with pt  Chesley Mires, PharmD, MPH, BCPS, CPP Clinical Pharmacist (Rheumatology and Pulmonology)

## 2022-11-08 NOTE — Telephone Encounter (Signed)
Received notice of Nucala new start via Epic chat. Pharmacy team does not have paperwork yet.  Patient appears to have both [employer] and [place] Medicaid.  Submitted a Prior Authorization request to [employer] Counselling psychologist) for [employer] via CoverMyMeds. Will update once we receive a response.  Key: [id number]  Chesley Mires, PharmD, MPH, BCPS, CPP Clinical Pharmacist (Rheumatology and Pulmonology)

## 2022-11-12 ENCOUNTER — Other Ambulatory Visit (HOSPITAL_COMMUNITY): Payer: Self-pay

## 2022-11-12 NOTE — Telephone Encounter (Signed)
Called patient. She states that she will plan to keep both [employer] plans and Medicaid is confirmed for 2024.  Patient is scheduled for Nucala new start on 11/19/2022.  Knox Saliva, PharmD, MPH, BCPS, CPP Clinical Pharmacist (Rheumatology and Pulmonology)

## 2022-11-13 ENCOUNTER — Other Ambulatory Visit (HOSPITAL_COMMUNITY): Payer: Self-pay

## 2022-11-19 ENCOUNTER — Other Ambulatory Visit (HOSPITAL_COMMUNITY): Payer: Self-pay

## 2022-11-19 ENCOUNTER — Telehealth: Payer: Self-pay | Admitting: Pharmacist

## 2022-11-19 ENCOUNTER — Ambulatory Visit: Payer: Medicaid Other | Admitting: Pharmacist

## 2022-11-19 ENCOUNTER — Other Ambulatory Visit: Payer: Self-pay

## 2022-11-19 DIAGNOSIS — J4550 Severe persistent asthma, uncomplicated: Secondary | ICD-10-CM

## 2022-11-19 DIAGNOSIS — Z7189 Other specified counseling: Secondary | ICD-10-CM

## 2022-11-19 LAB — CBC WITH DIFFERENTIAL/PLATELET
Basophils Absolute: 0.1 10*3/uL (ref 0.0–0.1)
Basophils Relative: 0.8 % (ref 0.0–3.0)
Eosinophils Absolute: 0.3 10*3/uL (ref 0.0–0.7)
Eosinophils Relative: 3.3 % (ref 0.0–5.0)
HCT: 42.8 % (ref 36.0–46.0)
Hemoglobin: 14.3 g/dL (ref 12.0–15.0)
Lymphocytes Relative: 40.3 % (ref 12.0–46.0)
Lymphs Abs: 3.3 10*3/uL (ref 0.7–4.0)
MCHC: 33.5 g/dL (ref 30.0–36.0)
MCV: 88.6 fl (ref 78.0–100.0)
Monocytes Absolute: 0.7 10*3/uL (ref 0.1–1.0)
Monocytes Relative: 8.2 % (ref 3.0–12.0)
Neutro Abs: 3.9 10*3/uL (ref 1.4–7.7)
Neutrophils Relative %: 47.4 % (ref 43.0–77.0)
Platelets: 245 10*3/uL (ref 150.0–400.0)
RBC: 4.83 Mil/uL (ref 3.87–5.11)
RDW: 14.2 % (ref 11.5–15.5)
WBC: 8.3 10*3/uL (ref 4.0–10.5)

## 2022-11-19 MED ORDER — NUCALA 100 MG/ML ~~LOC~~ SOAJ
100.0000 mg | SUBCUTANEOUS | 4 refills | Status: DC
  Filled 2022-11-19 – 2022-12-02 (×2): qty 1, 28d supply, fill #0
  Filled 2023-01-02: qty 1, 28d supply, fill #1
  Filled 2023-01-30: qty 1, 28d supply, fill #2
  Filled 2023-03-07: qty 1, 28d supply, fill #3
  Filled 2023-04-04: qty 1, 28d supply, fill #4

## 2022-11-19 MED ORDER — TRELEGY ELLIPTA 200-62.5-25 MCG/ACT IN AEPB: 1.0000 | INHALATION_SPRAY | Freq: Every day | RESPIRATORY_TRACT | 11 refills | Status: DC

## 2022-11-19 MED ORDER — TRELEGY ELLIPTA 200-62.5-25 MCG/ACT IN AEPB: 1.0000 | INHALATION_SPRAY | Freq: Every day | RESPIRATORY_TRACT | 5 refills | Status: DC

## 2022-11-19 NOTE — Telephone Encounter (Signed)
Pt called back and stated she will be here for her Nucala new start appt. Routing to [facility] as an Pharmacist, hospital.

## 2022-11-19 NOTE — Telephone Encounter (Addendum)
Patient started Nucala in clinic today. She said she discontinued Cigna as of 11/18/2022. Only has Medicaid now as primary. Please run PA through [facility] plan  Next dose is due on 12/17/2022/ She is having updated CBC w diff drawn today.  Knox Saliva, PharmD, MPH, BCPS, CPP Clinical Pharmacist (Rheumatology and Pulmonology)

## 2022-11-19 NOTE — Telephone Encounter (Signed)
ATC patient to confirm afternoon appt at pulm for Nucala new start. Unable to reach x 2 . VM box is full

## 2022-11-19 NOTE — Patient Instructions (Signed)
Please have your CBC drawn today  Your next NUCALA dose is due on 12/17/22, 01/14/23, and every 4 weeks thereafter  CONTINUE Trelegy 1 puff once daily  Your prescription will be shipped from [place]. Their phone number is [phone]. They should call to schedule shipment and confirm address. They will mail your medication to your home.  You will need to be seen by your provider in 3 to 4 months to assess how NUCALA is working for you. Please ensure you have a follow-up appointment scheduled in April or May 2024. Call our clinic if you need to make this appointment.  How to manage an injection site reaction: Remember the 5 C's: COUNTER - leave on the counter at least 30 minutes but up to overnight to bring medication to room temperature. This may help prevent stinging COLD - place something cold (like an ice gel pack or cold water bottle) on the injection site just before cleansing with alcohol. This may help reduce pain CLARITIN - use Claritin (generic name is loratadine) for the first two weeks of treatment or the day of, the day before, and the day after injecting. This will help to minimize injection site reactions CORTISONE CREAM - apply if injection site is irritated and itching CALL ME - if injection site reaction is bigger than the size of your fist, looks infected, blisters, or if you develop hives

## 2022-11-19 NOTE — Progress Notes (Signed)
HPI Patient presents today to [facility] to see pharmacy team for [facility] new start for severe persistent asthma.  Past medical history includes history of tobacco use, history of opioid use disorder.  She does not like using prednisone for flare-ups of asthma but does use her rescue inhaler frequently through the course of the week. She used her rescue inhaler 3 times during the day yesterday and approximately twice overnight over the course of last week  Respiratory Medications Current regimen: Trelegy 200-62.5-25mcg (1 puffs once daily) Patient reports no known adherence challenges  OBJECTIVE Allergies  Allergen Reactions   Penicillins Rash    Childhood allergy     Outpatient Encounter Medications as of 11/19/2022  Medication Sig   albuterol (PROVENTIL) (2.5 MG/3ML) 0.083% nebulizer solution Take 3 mLs (2.5 mg total) by nebulization every 4 (four) hours as needed for wheezing or shortness of breath.   albuterol (VENTOLIN HFA) 108 (90 Base) MCG/ACT inhaler Inhale 2 puffs into the lungs every 4 (four) hours as needed for wheezing or shortness of breath.   buprenorphine-naloxone (SUBOXONE) 8-2 mg SUBL SL tablet Place 1 tablet under the tongue in the morning, at noon, and at bedtime.   clonazePAM (KLONOPIN) 1 MG tablet Take 1 mg by mouth 3 (three) times daily as needed for anxiety.   FLOVENT HFA 110 MCG/ACT inhaler Inhale 1 puff into the lungs 2 (two) times daily.   fluconazole (DIFLUCAN) 100 MG tablet Take 2 tablets on day 1, 1 tablet on days 2 through 7.   fluticasone-salmeterol (ADVAIR DISKUS) 500-50 MCG/ACT AEPB Inhale 1 puff into the lungs in the morning and at bedtime. (Patient not taking: Reported on 10/16/2022)   Mepolizumab (NUCALA) 100 MG/ML SOAJ Inject 1 mL (100 mg total) into the skin every 28 (twenty-eight) days. Courier to pulm: [address], [address], [place] [address]. Appt on 11/14/2022   nystatin (MYCOSTATIN) 100000 UNIT/ML suspension Take 5 mLs (500,000 Units  total) by mouth 4 (four) times daily.   No facility-administered encounter medications on file as of 11/19/2022.     Immunization History  Administered Date(s) Administered   Influenza Inj Mdck Quad Pf 09/27/2021   Influenza,inj,Quad PF,6+ Mos 12/16/2014   Moderna Sars-Covid-2 Vaccination 04/18/2020, 05/16/2020   Pneumococcal Polysaccharide-23 12/16/2014   Tdap 07/01/2015, 12/21/2018     PFTs    Latest Ref Rng & Units 07/22/2022   10:53 AM  PFT Results  FVC-Pre L 3.20   FVC-Predicted Pre % 87   FVC-Post L 3.37   FVC-Predicted Post % 92   Pre FEV1/FVC % % 81   Post FEV1/FCV % % 81   FEV1-Pre L 2.60   FEV1-Predicted Pre % 87   FEV1-Post L 2.73   DLCO uncorrected ml/min/mmHg 23.70   DLCO UNC% % 109   DLCO corrected ml/min/mmHg 23.70   DLCO COR %Predicted % 109   DLVA Predicted % 105   TLC L 6.15   TLC % Predicted % 121   RV % Predicted % 176      Eosinophils Updating CBC w diff today  Assessment   Biologics training for mepolizumab (Nucala)  Goals of therapy: Mechanism of Action: Not fully understood. It does act an interleukin-5 (IL-5) antagonist monoclonal antibody that reduces the production and survival of eosinophils by blocking the binding of IL-5 to the alpha chain of the receptor complex on the eosinophil cell surface. Reviewed that Nucala is add-on medication and patient must continue maintenance inhaler regimen. Response to therapy: may take  3 months to 6 months to determine efficacy. Discussed that patients generally feel improvement sooner than 3 months.  Side effects: headache (19%), injection site reaction (7-15%), antibody development (6%), backache (5%), fatigue (5%)  Dose: 100 mg subcutaneously every 4 weeks  Administration/Storage:  Reviewed administration sites of thigh or abdomen (at least 2-3 inches away from abdomen). Reviewed the upper arm is only appropriate if caregiver is administering injection  Do not shake the reconstituted solution as  this could lead to product foaming or precipitation. Solution should be clear to opalescent and colorless to pale yellow or pale brown, essentially particle free. Small air bubbles, however, are expected and acceptable. If particulate matter remains in the solution or if the solution appears cloudy or milky, discard the solution.  Reviewed storage of medication in refrigerator. Reviewed that Nucala can be stored at room temperature in unopened carton for up to 7 days.  Access: Approval of Nucala through: insurance Patient enrolled into copay card program to help with copay assistance.  Patient self-administered Nucala 100mg /mL in left lower abdomen using [facility]-supplied medication: Nucala 100mg /mL autoinjector pen NDC: [phone] Lot: [id number] Expiration: 03/2024  Patient monitored for 30 minutes for adverse reaction.  Patient tolerated without issue. Injection site checked and no redness or swelling noted. Patient denies itchiness and irritation  Medication Reconciliation  A drug regimen assessment was performed, including review of allergies, interactions, disease-state management, dosing and immunization history. Medications were reviewed with the patient, including name, instructions, indication, goals of therapy, potential side effects, importance of adherence, and safe use.  Drug interaction(s): none noted   PLAN Continue Nucala 100mg  every 4 weeks. Next dose is due 12/17/2022 and every 4 weeks thereafter. Rx sent to: [facility]: [phone] . Patient provided with pharmacy phone number and advised that they will her to schedule shipment to home.  Continue maintenance asthma regimen of: Trelegy 200-62.5-25mcg (1 puffs once daily)  All questions encouraged and answered.  Instructed patient to reach out with any further questions or concerns.  Thank you for allowing pharmacy to participate in this patient's care.  This appointment required 60 minutes of  patient care (this includes precharting, chart review, review of results, face-to-face care, etc.).   [facility], PharmD, MPH, BCPS, CPP Clinical Pharmacist (Rheumatology and Pulmonology)

## 2022-11-20 NOTE — Telephone Encounter (Signed)
Abs eos on 11/19/2022 was 300. Submitted a Prior Authorization request to  [employer]  for [employer] via fax on CoverMyMeds. Will update once we receive a response.  Key: [id number]  Knox Saliva, PharmD, MPH, BCPS, CPP Clinical Pharmacist (Rheumatology and Pulmonology)

## 2022-11-22 ENCOUNTER — Other Ambulatory Visit (HOSPITAL_COMMUNITY): Payer: Self-pay

## 2022-11-22 NOTE — Telephone Encounter (Signed)
Received fax from [facility] stating that prior authorization cannot be processed as patient still has alternative benefits. Per eligibility check in [place], her Christella Scheuermann plan remains active. Will close encounter for now. [facility] will reach out regarding PA needs with subsequent refills if anything changes with her coverage.  Knox Saliva, PharmD, MPH, BCPS, CPP Clinical Pharmacist (Rheumatology and Pulmonology)

## 2022-11-27 ENCOUNTER — Telehealth: Payer: Self-pay

## 2022-11-27 ENCOUNTER — Other Ambulatory Visit (HOSPITAL_COMMUNITY): Payer: Self-pay

## 2022-11-27 NOTE — Telephone Encounter (Signed)
PA request received through [facility] for Trelegy Ellipta 200-62.5-25MCG/ACT aerosol powder  PA has been submitted to [facility] and has been APPROVED 11/26/2022-11/26/2023  Key: Durenda Guthrie

## 2022-12-01 ENCOUNTER — Encounter: Payer: Self-pay | Admitting: Pulmonary Disease

## 2022-12-02 ENCOUNTER — Other Ambulatory Visit (HOSPITAL_COMMUNITY): Payer: Self-pay

## 2022-12-02 ENCOUNTER — Other Ambulatory Visit: Payer: Self-pay

## 2022-12-02 MED ORDER — TRELEGY ELLIPTA 200-62.5-25 MCG/ACT IN AEPB: 1.0000 | INHALATION_SPRAY | Freq: Every day | RESPIRATORY_TRACT | 0 refills | Status: DC

## 2022-12-02 NOTE — Telephone Encounter (Signed)
Called and spoke with patient. She stated that she no longer has [employer] coverage. She has [place] Medicaid now. The prior authorization that was completed last week is no longer in effect. She is completely out of her Trelegy. I advised her that I would leave 2 samples at the front desk and see if the PA team can do the PA via Medicaid. She verbalized understanding.   PA Team, can we do a PA for her Trelegy 200 for Medicaid? Thanks!

## 2022-12-03 ENCOUNTER — Other Ambulatory Visit (HOSPITAL_COMMUNITY): Payer: Self-pay

## 2022-12-04 ENCOUNTER — Other Ambulatory Visit (HOSPITAL_COMMUNITY): Payer: Self-pay

## 2022-12-06 ENCOUNTER — Other Ambulatory Visit (HOSPITAL_COMMUNITY): Payer: Self-pay

## 2022-12-09 ENCOUNTER — Other Ambulatory Visit: Payer: Self-pay

## 2022-12-09 ENCOUNTER — Other Ambulatory Visit (HOSPITAL_COMMUNITY): Payer: Self-pay

## 2022-12-10 ENCOUNTER — Other Ambulatory Visit (HOSPITAL_COMMUNITY): Payer: Self-pay

## 2022-12-11 ENCOUNTER — Other Ambulatory Visit (HOSPITAL_COMMUNITY): Payer: Self-pay

## 2022-12-12 ENCOUNTER — Other Ambulatory Visit (HOSPITAL_COMMUNITY): Payer: Self-pay

## 2022-12-16 ENCOUNTER — Other Ambulatory Visit (HOSPITAL_COMMUNITY): Payer: Self-pay

## 2022-12-16 ENCOUNTER — Other Ambulatory Visit: Payer: Self-pay

## 2022-12-17 ENCOUNTER — Telehealth: Payer: Self-pay | Admitting: Pulmonary Disease

## 2022-12-18 ENCOUNTER — Other Ambulatory Visit (HOSPITAL_COMMUNITY): Payer: Self-pay

## 2022-12-18 ENCOUNTER — Other Ambulatory Visit: Payer: Self-pay

## 2022-12-18 NOTE — Telephone Encounter (Signed)
ATC pt, however calling the phone number provided (which is the same as what is on file) only results in a "busy" tone despite multiple attempts. Responded via [employer].

## 2022-12-19 ENCOUNTER — Other Ambulatory Visit (HOSPITAL_COMMUNITY): Payer: Self-pay

## 2022-12-20 NOTE — Telephone Encounter (Signed)
Based on dispense history, patient's Nucala was filled on 12/19/2022 by [facility]. Nothing further needed.  Knox Saliva, PharmD, MPH, BCPS, CPP Clinical Pharmacist (Rheumatology and Pulmonology)

## 2022-12-26 ENCOUNTER — Ambulatory Visit
Admission: RE | Admit: 2022-12-26 | Discharge: 2022-12-26 | Disposition: A | Discharge status: Home / Self Care | Payer: Medicaid Other | Source: Ambulatory Visit | Attending: Family Medicine | Admitting: Family Medicine

## 2022-12-26 DIAGNOSIS — Z1231 Encounter for screening mammogram for malignant neoplasm of breast: Secondary | ICD-10-CM

## 2022-12-31 ENCOUNTER — Other Ambulatory Visit: Payer: Self-pay | Admitting: Family Medicine

## 2022-12-31 DIAGNOSIS — R928 Other abnormal and inconclusive findings on diagnostic imaging of breast: Secondary | ICD-10-CM

## 2023-01-02 ENCOUNTER — Other Ambulatory Visit (HOSPITAL_COMMUNITY): Payer: Self-pay

## 2023-01-07 ENCOUNTER — Ambulatory Visit
Admission: RE | Admit: 2023-01-07 | Discharge: 2023-01-07 | Disposition: A | Discharge status: Home / Self Care | Payer: Medicaid Other | Source: Ambulatory Visit | Attending: Family Medicine | Admitting: Family Medicine

## 2023-01-07 ENCOUNTER — Ambulatory Visit: Payer: Medicaid Other

## 2023-01-07 DIAGNOSIS — R928 Other abnormal and inconclusive findings on diagnostic imaging of breast: Secondary | ICD-10-CM

## 2023-01-10 ENCOUNTER — Other Ambulatory Visit (HOSPITAL_COMMUNITY): Payer: Self-pay

## 2023-01-22 ENCOUNTER — Encounter: Payer: Self-pay | Admitting: Pulmonary Disease

## 2023-01-22 ENCOUNTER — Ambulatory Visit (INDEPENDENT_AMBULATORY_CARE_PROVIDER_SITE_OTHER): Payer: Medicaid Other | Admitting: Pulmonary Disease

## 2023-01-22 ENCOUNTER — Telehealth: Payer: Self-pay

## 2023-01-22 VITALS — BP 128/72 | HR 82 | Wt 190.8 lb

## 2023-01-22 DIAGNOSIS — G4730 Sleep apnea, unspecified: Secondary | ICD-10-CM | POA: Diagnosis not present

## 2023-01-22 DIAGNOSIS — J4550 Severe persistent asthma, uncomplicated: Secondary | ICD-10-CM | POA: Diagnosis not present

## 2023-01-22 NOTE — Patient Instructions (Addendum)
Nice to see you again  Continue Trelegy for now, I will look into the prior authorization  If needed we can decrease the inhaler to something less strong if Trelegy is not approved  I am glad the injections are helping  Return to clinic in 6 months or sooner if needed with Dr. Silas Flood

## 2023-01-22 NOTE — Telephone Encounter (Signed)
Patient is needing a prior auth for her Trelegy inhaler  Please advise and thank you

## 2023-01-22 NOTE — Progress Notes (Signed)
$'@Patient'b$  ID: Meagan Ramos, female    DOB: Sep 05, 1976, 47 y.o.   MRN: [id number]  Chief Complaint  Patient presents with   Follow-up    Pt is here for follow up for DOE. Pt is needing a PA for trelegy. Pt is still on nucala shots and is needing to get cleared for surgery     Referring provider: Corrington, Kip A, MD  HPI:   47 y.o. woman whom we are seeing in follow up for evaluation of DOE, cough felt to be related to poorly controlled asthma.  Most recent pulmonary note from pharmacist reviewed.  Process for Nucala started at last visit given mild elevation in eosinophils and frequent albuterol use.  Started 11/2022.  She now is using albuterol very infrequently.  Less than once a week.  Only time she needed it recently is as her Trelegy is running out using it every other day as opposed to daily.  So she uses the other night.  Otherwise not really using albuterol at all.  Sounds like there is a plan for likely upcoming surgery on her elbow.  Discussed that given her markedly improved symptoms probably an okay time to move forward with this in the future.   HPI at initial visit: Patient reports shortness of breath for about 3 months.  Possibly just worsening over the last 3 months.  There is no dating back to at least 2022 for shortness of breath including ED visit.  That note was also reviewed.  At that time February 2022 chest x-ray on my read interpretation was clear.  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  Shortness of breath stable throughout the day.  Worsening with inclines or stairs.  No times a day with things are better or worse.  No seasonal environmental factors she can identify that make things better or worse.  No position make things better or worse.  Uses albuterol sometimes when she cannot breathe well the inhaler does not work well.  However albuterol  nebulizer grandmother's house is quite effective.  Also prescribed Flovent recently.  She does not feel like this helps at all.  No other alleviating or exacerbating factors.   PMH: Tobacco abuse Surgical history: Cholecystectomy, mandible surgery, tubal ligation Family history: Mother with thyroid cancer, hypertension, father with lung cancer Social history: 30-pack-year smoking history, down to half a pack a day more recently with worsening shortness of breath, works as Lawyer / Pulmonary Flowsheets:   ACT:      No data to display           MMRC:     No data to display           Epworth:      No data to display           Tests:   FENO:  No results found for: "NITRICOXIDE"  PFT:    Latest Ref Rng & Units 07/22/2022   10:53 AM  PFT Results  FVC-Pre L 3.20   FVC-Predicted Pre % 87   FVC-Post L 3.37   FVC-Predicted Post % 92   Pre FEV1/FVC % % 81   Post FEV1/FCV % % 81   FEV1-Pre L 2.60   FEV1-Predicted Pre % 87   FEV1-Post L 2.73   DLCO uncorrected ml/min/mmHg 23.70   DLCO UNC% % 109  DLCO corrected ml/min/mmHg 23.70   DLCO COR %Predicted % 109   DLVA Predicted % 105   TLC L 6.15   TLC % Predicted % 121   RV % Predicted % 176   Personally reviewed and interpreted as normal spirometry without significant bronchodilator response.  Lung volumes indicate hyperinflation and air trapping.  DLCO within normal limits.  WALK:      No data to display           Imaging: Personally reviewed and as per EMR discussion this note MM DIAG BREAST TOMO UNI RIGHT  Result Date: 01/07/2023 CLINICAL DATA:  47 year old female recalled from screening mammogram dated 12/26/2022 for possible right breast asymmetries. EXAM: DIGITAL DIAGNOSTIC UNILATERAL RIGHT MAMMOGRAM WITH TOMOSYNTHESIS TECHNIQUE: Right digital diagnostic mammography and breast tomosynthesis was performed. COMPARISON:  Previous exam(s). ACR Breast Density Category b:  There are scattered areas of fibroglandular density. FINDINGS: Previously described, possible asymmetries in the upper outer right breast at mid depth effaces into well dispersed fibroglandular tissue on today's additional views. No suspicious findings identified. IMPRESSION: No mammographic evidence of malignancy. RECOMMENDATION: Screening mammogram in one year.(Code:SM-B-01Y) I have discussed the findings and recommendations with the patient. If applicable, a reminder letter will be sent to the patient regarding the next appointment. BI-RADS CATEGORY  1: Negative. Electronically Signed   By: Kristopher Oppenheim M.D.   On: 01/07/2023 14:41  MM 3D SCREEN BREAST BILATERAL  Result Date: 12/30/2022 CLINICAL DATA:  Screening. EXAM: DIGITAL SCREENING BILATERAL MAMMOGRAM WITH TOMOSYNTHESIS AND CAD TECHNIQUE: Bilateral screening digital craniocaudal and mediolateral oblique mammograms were obtained. Bilateral screening digital breast tomosynthesis was performed. The images were evaluated with computer-aided detection. COMPARISON:  None. ACR Breast Density Category b: There are scattered areas of fibroglandular density. FINDINGS: In the right breast, 2 adjacent right breast asymmetries warrant further evaluation. In the left breast, no findings suspicious for malignancy. IMPRESSION: Further evaluation is suggested for 2 adjacent possible asymmetries in the right breast. RECOMMENDATION: Diagnostic mammogram and possibly ultrasound of the right breast. (Code:[id number]-51M) The patient will be contacted regarding the findings, and additional imaging will be scheduled. BI-RADS CATEGORY  0: Incomplete: Need additional imaging evaluation. Electronically Signed   By: Marin Olp M.D.   On: 12/30/2022 14:26    Lab Results: Personally reviewed CBC    Component Value Date/Time   WBC 8.3 11/19/2022 1502   RBC 4.83 11/19/2022 1502   HGB 14.3 11/19/2022 1502   HCT 42.8 11/19/2022 1502   PLT 245.0 11/19/2022 1502   MCV 88.6  11/19/2022 1502   MCH 29.4 12/26/2020 2153   MCHC 33.5 11/19/2022 1502   RDW 14.2 11/19/2022 1502   LYMPHSABS 3.3 11/19/2022 1502   MONOABS 0.7 11/19/2022 1502   EOSABS 0.3 11/19/2022 1502   BASOSABS 0.1 11/19/2022 1502    BMET    Component Value Date/Time   NA 139 12/26/2020 2153   K 3.3 (L) 12/26/2020 2153   CL 106 12/26/2020 2153   CO2 23 12/26/2020 2153   GLUCOSE 103 (H) 12/26/2020 2153   BUN 9 12/26/2020 2153   CREATININE 0.71 12/26/2020 2153   CREATININE 0.52 12/16/2014 1034   CALCIUM 9.1 12/26/2020 2153   GFRNONAA >60 12/26/2020 2153   GFRNONAA >89 12/16/2014 1034   GFRAA >60 06/05/2020 0435   GFRAA >89 12/16/2014 1034    BNP    Component Value Date/Time   BNP 49.4 12/26/2020 2153    ProBNP No results found for: "PROBNP"  Specialty Problems  Pulmonary Problems   Chronic cough    Allergies  Allergen Reactions   Penicillins Rash    Childhood allergy     Immunization History  Administered Date(s) Administered   Hepatitis B, ADULT 01/03/2005   Influenza Inj Mdck Quad Pf 09/27/2021, 11/14/2022   Influenza,inj,Quad PF,6+ Mos 12/16/2014, 09/15/2018   Moderna Sars-Covid-2 Vaccination 04/18/2020, 05/16/2020   Pneumococcal Polysaccharide-23 12/16/2014   Tdap 07/01/2015, 12/21/2018    History reviewed. No pertinent past medical history.  Tobacco History: Social History   Tobacco Use  Smoking Status Every Day   Packs/day: 0.50   Years: 23.00   Total pack years: 11.50   Types: Cigarettes  Smokeless Tobacco Never  Tobacco Comments   Pt states that she smoke 1/2 ppd. ALS 7/27   Ready to quit: Not Answered Counseling given: Not Answered Tobacco comments: Pt states that she smoke 1/2 ppd. ALS 7/27   Continue to not smoke  Outpatient Encounter Medications as of 01/22/2023  Medication Sig   albuterol (PROVENTIL) (2.5 MG/3ML) 0.083% nebulizer solution Take 3 mLs (2.5 mg total) by nebulization every 4 (four) hours as needed for wheezing or  shortness of breath.   albuterol (VENTOLIN HFA) 108 (90 Base) MCG/ACT inhaler Inhale 2 puffs into the lungs every 4 (four) hours as needed for wheezing or shortness of breath.   buprenorphine-naloxone (SUBOXONE) 8-2 mg SUBL SL tablet Place 1 tablet under the tongue in the morning, at noon, and at bedtime.   clonazePAM (KLONOPIN) 1 MG tablet Take 1 mg by mouth 3 (three) times daily as needed for anxiety.   Fluticasone-Umeclidin-Vilant (TRELEGY ELLIPTA) 200-62.5-25 MCG/ACT AEPB Inhale 1 puff into the lungs daily.   Fluticasone-Umeclidin-Vilant (TRELEGY ELLIPTA) 200-62.5-25 MCG/ACT AEPB Inhale 1 puff into the lungs daily.   Mepolizumab (NUCALA) 100 MG/ML SOAJ Inject 1 mL (100 mg total) into the skin every 28 (twenty-eight) days.   nystatin (MYCOSTATIN) 100000 UNIT/ML suspension Take 5 mLs (500,000 Units total) by mouth 4 (four) times daily.   No facility-administered encounter medications on file as of 01/22/2023.     Review of Systems  Review of Systems  N/a  Physical Exam  BP 128/72 (BP Location: Left Arm, Patient Position: Sitting, Cuff Size: Normal)   Pulse 82   Wt 190 lb 12.8 oz (86.5 kg)   SpO2 98%   BMI 32.75 kg/m   Wt Readings from Last 5 Encounters:  01/22/23 190 lb 12.8 oz (86.5 kg)  10/16/22 185 lb 9.6 oz (84.2 kg)  07/22/22 178 lb (80.7 kg)  06/06/22 180 lb (81.6 kg)  06/03/20 191 lb (86.6 kg)    BMI Readings from Last 5 Encounters:  01/22/23 32.75 kg/m  10/16/22 31.86 kg/m  07/22/22 30.55 kg/m  06/06/22 32.92 kg/m  06/03/20 34.93 kg/m     Physical Exam General: Well-appearing, sitting in chair Eyes: EOMI, no icterus Neck: Supple, no JVP Pulmonary: Clear, no wheeze Cardiovascular: Warm, no edema Abdomen: Nondistended, bowel sounds present MSK: No synovitis, no joint effusion Neuro: Normal gait, no weakness Psych: Normal mood, full affect   Assessment & Plan:   Dyspnea on exertion and cough largely related to asthma:  EMR indicates it has been a  problem dating back to at least 2022.  Improved with albuterol nebulized.  Some improved with prednisone.  Further temporary improvement with high-dose ICS/LABA therapy via Advair discus.  PFTs confirmed presence of hyperinflation and air trapping.  Further improvement with high-dose Trelegy.  Nucala added 11/2022 given uncontrolled symptoms, frequent albuterol use.  Marked improvement  in symptoms with addition of Nucala.  Continue Trelegy for now.  If not covered by insurance, plan to de-escalate to Symbicort given improvement in symptoms on Nucala.  Preoperative evaluation: Pulmonary medicine does not provide surgical clearance but rather a preoperative risk assessment.  Based on the ARISCAT model, patient is low at 1.3% risk of postoperative pulmonary complications.  No modifiable risk factors to address prior to surgery.  Sleep disordered breathing: Concern for intermittent low oxygen at night on her watch, isolated event but still reported.  Also been told she stops breathing in the past.  Home sleep study ordered for further evaluation.  Return in about 6 months (around 07/25/2023).   Lanier Clam, MD 01/22/2023

## 2023-01-23 ENCOUNTER — Other Ambulatory Visit (HOSPITAL_COMMUNITY): Payer: Self-pay

## 2023-01-23 ENCOUNTER — Telehealth: Payer: Self-pay

## 2023-01-23 NOTE — Telephone Encounter (Signed)
PA request received via provider for Trelegy Ellipta 200-62.5-25MCG/ACT aerosol powder  PA faxed to [place] via [facility] and is pending determination.  Key: [id number]

## 2023-01-23 NOTE — Telephone Encounter (Signed)
PA has been submitted to patients new plan ([employer] [place]) and is pending determination, will update in additional encounter created.

## 2023-01-30 ENCOUNTER — Other Ambulatory Visit (HOSPITAL_COMMUNITY): Payer: Self-pay

## 2023-02-11 ENCOUNTER — Other Ambulatory Visit: Payer: Self-pay

## 2023-02-13 DIAGNOSIS — J4550 Severe persistent asthma, uncomplicated: Secondary | ICD-10-CM | POA: Insufficient documentation

## 2023-03-07 ENCOUNTER — Other Ambulatory Visit (HOSPITAL_COMMUNITY): Payer: Self-pay

## 2023-03-11 ENCOUNTER — Other Ambulatory Visit (HOSPITAL_COMMUNITY): Payer: Self-pay

## 2023-03-11 DIAGNOSIS — Z4789 Encounter for other orthopedic aftercare: Secondary | ICD-10-CM | POA: Insufficient documentation

## 2023-03-20 ENCOUNTER — Other Ambulatory Visit (HOSPITAL_COMMUNITY): Payer: Self-pay

## 2023-03-20 ENCOUNTER — Other Ambulatory Visit: Payer: Self-pay | Admitting: *Deleted

## 2023-03-20 DIAGNOSIS — J4550 Severe persistent asthma, uncomplicated: Secondary | ICD-10-CM

## 2023-03-20 NOTE — Telephone Encounter (Signed)
Dr. Judeth Horn, please see note from pharmacy team and advise.  Thank you.

## 2023-03-20 NOTE — Telephone Encounter (Signed)
Please send Symbicort 160 dose 2 puff BID - thanks!

## 2023-03-20 NOTE — Telephone Encounter (Signed)
Pt called the office back and I let her know about the prior auth that was done and results of the prior auth. Let her know that we had sent Rx for Symbicort to pharmacy to replace Trelegy and she verbalized understanding. Nothing further needed.

## 2023-03-20 NOTE — Telephone Encounter (Signed)
PA DENIED:   Patient must try/fail preferred alternatives:

## 2023-03-20 NOTE — Addendum Note (Signed)
Addended by: Wyvonne Lenz on: 03/20/2023 02:10 PM   Modules accepted: Orders

## 2023-03-20 NOTE — Telephone Encounter (Signed)
ATC patient x1.  Left detailed message per DPR.  Awaiting return call to verify pharmacy.  Order for Symbicort is pended.

## 2023-03-28 ENCOUNTER — Ambulatory Visit: Payer: Medicaid Other | Attending: Orthopedic Surgery

## 2023-04-04 ENCOUNTER — Other Ambulatory Visit (HOSPITAL_COMMUNITY): Payer: Self-pay

## 2023-04-11 ENCOUNTER — Other Ambulatory Visit: Payer: Self-pay

## 2023-04-11 ENCOUNTER — Telehealth: Payer: Medicaid Other

## 2023-04-11 ENCOUNTER — Other Ambulatory Visit (HOSPITAL_COMMUNITY): Payer: Self-pay

## 2023-04-11 NOTE — Telephone Encounter (Signed)
Received notification from [facility] that pt needs a new PA.  Submitted a FAXED Prior Authorization request to  [employer]  for NUCALA via CoverMyMeds. Will update once we receive a response.  Key: [id number]

## 2023-04-17 MED ORDER — BUDESONIDE-FORMOTEROL FUMARATE 160-4.5 MCG/ACT IN AERO: 2.0000 | INHALATION_SPRAY | Freq: Two times a day (BID) | RESPIRATORY_TRACT | 6 refills | Status: DC

## 2023-04-17 NOTE — Telephone Encounter (Signed)
Contacted Tourist information centre manager about further clarification of PA for Nucala. Fax stated that the Nucala was approved from 04/11/23 - 04/11/23.   According to the clinical pharmacist: Medicaid expansion patients qualified for continuation of treatment starting from 10/11/22 through 04/11/23 if they met the criteria of previous paid claims and notes from the doctor stating improvement. Patient did meet this criteria and was able to get a fill and paid claim on 04/11/23.  After 04/11/23, we must submit a new form for Nucala and the patient must meet a different set of criteria in order for her to continue therapy. If she is denied, then an appeal or peer to peer process may be started.   [employer] will fax the new form to [place] so that a PA can be started. Will await form.

## 2023-04-17 NOTE — Telephone Encounter (Signed)
Submitted a Prior Authorization request to  [employer]  for [employer]. Will update once we receive a response.  Fax: [phone] Phone: [phone]

## 2023-04-22 NOTE — Telephone Encounter (Signed)
Received a fax regarding Prior Authorization from  [employer]  for [employer]. Authorization has been DENIED because patient must have FEV1 < 80% and have reason why they cannot trial preferred options: Fasenra or Xolair.  Spoke with patient and she is comfortable with pharmacy team signing appeal representative form on her behalf  She took her last Nucala dose on 04/20/23  Chesley Mires, PharmD, MPH, BCPS, CPP Clinical Pharmacist (Rheumatology and Pulmonology)

## 2023-04-24 NOTE — Telephone Encounter (Signed)
Submitted an URGENT appeal to [employer] for NUCALA.  Reference # [id number] Phone: [phone] Fax: [phone]  Chesley Mires, PharmD, MPH, BCPS, CPP Clinical Pharmacist (Rheumatology and Pulmonology)

## 2023-04-29 NOTE — Telephone Encounter (Signed)
Received fax from [employer]. EXPEDITED appeal for Nucala - denial is OVERTURNED. Nucala is APPROVED from 04/25/23 through 04/24/24.  [facility] notified via email. [employer] updated. Reference # [id number]  Chesley Mires, PharmD, MPH, BCPS, CPP Clinical Pharmacist (Rheumatology and Pulmonology)

## 2023-05-06 ENCOUNTER — Other Ambulatory Visit (HOSPITAL_COMMUNITY): Payer: Self-pay

## 2023-05-06 ENCOUNTER — Other Ambulatory Visit: Payer: Self-pay | Admitting: Pulmonary Disease

## 2023-05-06 DIAGNOSIS — J4550 Severe persistent asthma, uncomplicated: Secondary | ICD-10-CM

## 2023-05-07 ENCOUNTER — Encounter: Payer: Self-pay | Admitting: Pulmonary Disease

## 2023-05-07 ENCOUNTER — Other Ambulatory Visit (HOSPITAL_COMMUNITY): Payer: Self-pay

## 2023-05-07 MED ORDER — NUCALA 100 MG/ML ~~LOC~~ SOAJ
100.0000 mg | SUBCUTANEOUS | 4 refills | Status: DC
Start: 2023-05-07 — End: 2023-10-02
  Filled 2023-05-07: qty 1, 28d supply, fill #0
  Filled 2023-06-02: qty 1, 28d supply, fill #1
  Filled 2023-07-04: qty 1, 28d supply, fill #2
  Filled 2023-07-28: qty 1, 28d supply, fill #3
  Filled 2023-08-29: qty 1, 28d supply, fill #4

## 2023-05-07 NOTE — Telephone Encounter (Signed)
Refill sent for [facility] to [facility]: [phone]   Dose: 100 mg SQ every 4 weeks  Last OV: 01/22/23 Provider: Dr. Judeth Horn  Next OV: not csheduled  Chesley Mires, PharmD, MPH, BCPS Clinical Pharmacist (Rheumatology and Pulmonology)

## 2023-05-08 ENCOUNTER — Other Ambulatory Visit: Payer: Self-pay

## 2023-05-08 ENCOUNTER — Other Ambulatory Visit (HOSPITAL_COMMUNITY): Payer: Self-pay

## 2023-05-09 ENCOUNTER — Other Ambulatory Visit (HOSPITAL_COMMUNITY): Payer: Self-pay

## 2023-05-13 NOTE — Telephone Encounter (Addendum)
Pharm team- can you check on the PA for symbicort

## 2023-05-20 ENCOUNTER — Other Ambulatory Visit (HOSPITAL_COMMUNITY): Payer: Self-pay

## 2023-05-20 NOTE — Telephone Encounter (Signed)
Brand Symbicort is covered at a $4.00 co-pay at this time.

## 2023-06-02 ENCOUNTER — Other Ambulatory Visit (HOSPITAL_COMMUNITY): Payer: Self-pay

## 2023-06-02 ENCOUNTER — Other Ambulatory Visit: Payer: Self-pay

## 2023-06-10 ENCOUNTER — Other Ambulatory Visit: Payer: Self-pay

## 2023-06-13 ENCOUNTER — Telehealth: Payer: Self-pay | Admitting: Pulmonary Disease

## 2023-06-13 DIAGNOSIS — G4730 Sleep apnea, unspecified: Secondary | ICD-10-CM

## 2023-06-13 NOTE — Telephone Encounter (Signed)
PT states Medicare will not cover HST so she needs to sched a in lab study. Please call to advise. Thanks.

## 2023-06-17 NOTE — Telephone Encounter (Signed)
Split night study ordered 

## 2023-06-18 ENCOUNTER — Other Ambulatory Visit: Payer: Self-pay | Admitting: Pulmonary Disease

## 2023-06-18 DIAGNOSIS — R0609 Other forms of dyspnea: Secondary | ICD-10-CM

## 2023-06-30 ENCOUNTER — Other Ambulatory Visit (HOSPITAL_COMMUNITY): Payer: Self-pay

## 2023-07-02 ENCOUNTER — Other Ambulatory Visit (HOSPITAL_COMMUNITY): Payer: Self-pay

## 2023-07-02 MED ORDER — WEGOVY 0.25 MG/0.5ML ~~LOC~~ SOAJ
0.2500 mg | SUBCUTANEOUS | 1 refills | Status: DC
  Filled 2023-07-02: qty 2, 28d supply, fill #0

## 2023-07-03 ENCOUNTER — Other Ambulatory Visit (HOSPITAL_COMMUNITY): Payer: Self-pay

## 2023-07-03 ENCOUNTER — Other Ambulatory Visit: Payer: Self-pay

## 2023-07-03 MED ORDER — WEGOVY 0.25 MG/0.5ML ~~LOC~~ SOAJ
0.2500 mg | SUBCUTANEOUS | 0 refills | Status: DC
  Filled 2023-07-03 – 2023-07-30 (×3): qty 2, 28d supply, fill #0

## 2023-07-04 ENCOUNTER — Other Ambulatory Visit (HOSPITAL_COMMUNITY): Payer: Self-pay

## 2023-07-07 ENCOUNTER — Other Ambulatory Visit (HOSPITAL_COMMUNITY): Payer: Self-pay

## 2023-07-08 ENCOUNTER — Other Ambulatory Visit (HOSPITAL_COMMUNITY): Payer: Self-pay

## 2023-07-10 ENCOUNTER — Other Ambulatory Visit (HOSPITAL_COMMUNITY): Payer: Self-pay

## 2023-07-22 ENCOUNTER — Other Ambulatory Visit: Payer: Self-pay

## 2023-07-25 ENCOUNTER — Other Ambulatory Visit (HOSPITAL_COMMUNITY): Payer: Self-pay

## 2023-07-27 ENCOUNTER — Ambulatory Visit (HOSPITAL_BASED_OUTPATIENT_CLINIC_OR_DEPARTMENT_OTHER): Payer: Medicaid Other | Attending: Pulmonary Disease | Admitting: Internal Medicine

## 2023-07-27 VITALS — Ht 62.5 in | Wt 179.0 lb

## 2023-07-27 DIAGNOSIS — G4734 Idiopathic sleep related nonobstructive alveolar hypoventilation: Secondary | ICD-10-CM | POA: Insufficient documentation

## 2023-07-27 DIAGNOSIS — R0902 Hypoxemia: Secondary | ICD-10-CM | POA: Insufficient documentation

## 2023-07-27 DIAGNOSIS — R9401 Abnormal electroencephalogram [EEG]: Secondary | ICD-10-CM | POA: Insufficient documentation

## 2023-07-27 DIAGNOSIS — G4730 Sleep apnea, unspecified: Secondary | ICD-10-CM | POA: Insufficient documentation

## 2023-07-28 ENCOUNTER — Other Ambulatory Visit: Payer: Self-pay

## 2023-07-29 ENCOUNTER — Other Ambulatory Visit (HOSPITAL_COMMUNITY): Payer: Self-pay

## 2023-07-30 ENCOUNTER — Other Ambulatory Visit (HOSPITAL_COMMUNITY): Payer: Self-pay

## 2023-08-01 ENCOUNTER — Other Ambulatory Visit (HOSPITAL_COMMUNITY): Payer: Self-pay

## 2023-08-12 ENCOUNTER — Telehealth: Payer: Self-pay

## 2023-08-12 ENCOUNTER — Other Ambulatory Visit (HOSPITAL_COMMUNITY): Payer: Self-pay

## 2023-08-12 ENCOUNTER — Encounter: Payer: Self-pay | Admitting: Pulmonary Disease

## 2023-08-12 ENCOUNTER — Telehealth: Payer: Self-pay | Admitting: Pulmonary Disease

## 2023-08-12 ENCOUNTER — Ambulatory Visit (INDEPENDENT_AMBULATORY_CARE_PROVIDER_SITE_OTHER): Payer: Medicaid Other | Admitting: Pulmonary Disease

## 2023-08-12 VITALS — BP 128/80 | HR 93 | Ht 62.5 in | Wt 176.8 lb

## 2023-08-12 DIAGNOSIS — R053 Chronic cough: Secondary | ICD-10-CM

## 2023-08-12 MED ORDER — QVAR REDIHALER 80 MCG/ACT IN AERB: 2.0000 | INHALATION_SPRAY | Freq: Two times a day (BID) | RESPIRATORY_TRACT | 11 refills | Status: DC

## 2023-08-12 NOTE — Patient Instructions (Signed)
Nice see you again  Continue Symbicort 2 puffs twice a day  Add Qvar 2 puffs twice a day, this is additional inhaled steroid to see if it helps with the cough and phlegm production  I will send a message to our team to see if we can get Trelegy approved via Medicaid given symptoms were better while on that compared to Symbicort  Continue Nucala  Continue albuterol as needed  I will look into the results of the sleep test  Return to clinic in 6 months or sooner as needed with Dr. Judeth Horn, please let me know if the cough is not getting better and we need to do something different before your next visit

## 2023-08-12 NOTE — Telephone Encounter (Signed)
PA request has been  processed and is awaiting clinical questions . New Encounter created for follow up. For additional info see Pharmacy Prior Auth telephone encounter from 08-12-2023.

## 2023-08-12 NOTE — Telephone Encounter (Signed)
Pharmacy Patient Advocate Encounter   Received notification from Pt Calls Messages that prior authorization for Trelegy Ellipta 200-62.5-25MCG/ACT aerosol powder is required/requested.   Insurance verification completed.   The patient is insured through [facility] .   Per test claim: PA required; PA started via CoverMyMeds. KEY [id number] . Waiting for clinical questions to populate.

## 2023-08-12 NOTE — Progress Notes (Signed)
@Patient  ID: Meagan Ramos, female    DOB: [birth year], 47 y.o.   MRN: [id number]  Chief Complaint  Patient presents with   Follow-up    Pt is here to discuss Sleep Study (07-29-2023) results. Pt states Symbicort does not work well for her.     Referring provider: Corrington, Meredith Mody, MD  HPI:   47 y.o. woman whom we are seeing in follow up for evaluation of DOE, cough felt to be related to poorly controlled asthma.  Multiple telephone encounters between clinical staff and pharmacist from pulmonary office in interim since last visit reviewed.  Returns for routine follow-up.  Continues to [employer].  Also on Symbicort. She likes Trelegy better but not covered by Medicare.Previously cough is much improved.  Less phlegm production etc.  Coughing a lot at night and in the mornings.  This has resolved on Trelegy.  Not well-controlled on Symbicort.  Split-night study done in the interim.  Results not available.  She states she was not placed on CPAP which is somewhat irritated by the sleep apnea is not present.  Awaiting results.   HPI at initial visit: Patient reports shortness of breath for about 3 months.  Possibly just worsening over the last 3 months.  There is no dating back to at least 2022 for shortness of breath including ED visit.  That note was also reviewed.  At that time February 2022 chest x-ray on my read interpretation was clear.  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  Shortness of breath stable throughout the day.  Worsening with inclines or stairs.  No times a day with things are better or worse.  No seasonal environmental factors she can identify that make things better or worse.  No position make things better or worse.  Uses albuterol sometimes when she cannot breathe well the inhaler does not work well.  However albuterol nebulizer grandmother's house is quite  effective.  Also prescribed Flovent recently.  She does not feel like this helps at all.  No other alleviating or exacerbating factors.   PMH: Tobacco abuse Surgical history: Cholecystectomy, mandible surgery, tubal ligation Family history: Mother with thyroid cancer, hypertension, father with lung cancer Social history: 30-pack-year smoking history, down to half a pack a day more recently with worsening shortness of breath, works as Secondary school teacher / Pulmonary Flowsheets:   ACT:      No data to display          MMRC:     No data to display          Epworth:      No data to display          Tests:   FENO:  No results found for: "NITRICOXIDE"  PFT:    Latest Ref Rng & Units 07/22/2022   10:53 AM  PFT Results  FVC-Pre L 3.20   FVC-Predicted Pre % 87   FVC-Post L 3.37   FVC-Predicted Post % 92   Pre FEV1/FVC % % 81   Post FEV1/FCV % % 81   FEV1-Pre L 2.60   FEV1-Predicted Pre % 87   FEV1-Post L 2.73   DLCO uncorrected ml/min/mmHg 23.70   DLCO UNC% % 109   DLCO corrected ml/min/mmHg 23.70   DLCO COR %Predicted % 109   DLVA Predicted % 105   TLC L 6.15  TLC % Predicted % 121   RV % Predicted % 176   Personally reviewed and interpreted as normal spirometry without significant bronchodilator response.  Lung volumes indicate hyperinflation and air trapping.  DLCO within normal limits.  WALK:      No data to display          Imaging: Personally reviewed and as per EMR discussion this note Sleep Study Documents  Result Date: 07/29/2023 Ordered by an unspecified provider.   Lab Results: Personally reviewed CBC    Component Value Date/Time   WBC 8.3 11/19/2022 1502   RBC 4.83 11/19/2022 1502   HGB 14.3 11/19/2022 1502   HCT 42.8 11/19/2022 1502   PLT 245.0 11/19/2022 1502   MCV 88.6 11/19/2022 1502   MCH 29.4 12/26/2020 2153   MCHC 33.5 11/19/2022 1502   RDW 14.2 11/19/2022 1502   LYMPHSABS 3.3 11/19/2022 1502    MONOABS 0.7 11/19/2022 1502   EOSABS 0.3 11/19/2022 1502   BASOSABS 0.1 11/19/2022 1502    BMET    Component Value Date/Time   NA 139 12/26/2020 2153   K 3.3 (L) 12/26/2020 2153   CL 106 12/26/2020 2153   CO2 23 12/26/2020 2153   GLUCOSE 103 (H) 12/26/2020 2153   BUN 9 12/26/2020 2153   CREATININE 0.71 12/26/2020 2153   CREATININE 0.52 12/16/2014 1034   CALCIUM 9.1 12/26/2020 2153   GFRNONAA >60 12/26/2020 2153   GFRNONAA >89 12/16/2014 1034   GFRAA >60 06/05/2020 0435   GFRAA >89 12/16/2014 1034    BNP    Component Value Date/Time   BNP 49.4 12/26/2020 2153    ProBNP No results found for: "PROBNP"  Specialty Problems       Pulmonary Problems   Chronic cough    Allergies  Allergen Reactions   Penicillins Rash    Childhood allergy     Immunization History  Administered Date(s) Administered   Hepatitis B, ADULT 01/03/2005   Influenza Inj Mdck Quad Pf 09/27/2021, 11/14/2022   Influenza,inj,Quad PF,6+ Mos 12/16/2014, 09/15/2018   Moderna Sars-Covid-2 Vaccination 04/18/2020, 05/16/2020   Pneumococcal Polysaccharide-23 12/16/2014   Tdap 07/01/2015, 12/21/2018    History reviewed. No pertinent past medical history.  Tobacco History: Social History   Tobacco Use  Smoking Status Every Day   Current packs/day: 0.50   Average packs/day: 0.5 packs/day for 23.0 years (11.5 ttl pk-yrs)   Types: Cigarettes  Smokeless Tobacco Never  Tobacco Comments   Pt states that she smoke 1/2 ppd. ALS 7/27   Ready to quit: Not Answered Counseling given: Not Answered Tobacco comments: Pt states that she smoke 1/2 ppd. ALS 7/27   Continue to not smoke  Outpatient Encounter Medications as of 08/12/2023  Medication Sig   beclomethasone (QVAR REDIHALER) 80 MCG/ACT inhaler Inhale 2 puffs into the lungs 2 (two) times daily.   albuterol (PROVENTIL) (2.5 MG/3ML) 0.083% nebulizer solution Take 3 mLs (2.5 mg total) by nebulization every 4 (four) hours as needed for wheezing or  shortness of breath.   albuterol (VENTOLIN HFA) 108 (90 Base) MCG/ACT inhaler INHALE 2 PUFFS BY MOUTH EVERY 4 HOURS AS NEEDED FOR WHEEZING OR SHORTNESS OF BREATH   budesonide-formoterol (SYMBICORT) 160-4.5 MCG/ACT inhaler Inhale 2 puffs into the lungs 2 (two) times daily.   buprenorphine-naloxone (SUBOXONE) 8-2 mg SUBL SL tablet Place 1 tablet under the tongue in the morning, at noon, and at bedtime.   clonazePAM (KLONOPIN) 1 MG tablet Take 1 mg by mouth 3 (three) times daily as needed  for anxiety.   Mepolizumab (NUCALA) 100 MG/ML SOAJ Inject 1 mL (100 mg total) into the skin every 28 (twenty-eight) days.   nystatin (MYCOSTATIN) 100000 UNIT/ML suspension Take 5 mLs (500,000 Units total) by mouth 4 (four) times daily.   [DISCONTINUED] Semaglutide-Weight Management (WEGOVY) 0.25 MG/0.5ML SOAJ Inject 0.25 mg into the skin once a week.   [DISCONTINUED] Semaglutide-Weight Management (WEGOVY) 0.25 MG/0.5ML SOAJ Inject 0.25 mg into the skin once a week.   No facility-administered encounter medications on file as of 08/12/2023.     Review of Systems  Review of Systems  N/a  Physical Exam  BP 128/80 (BP Location: Left Arm, Cuff Size: Normal)   Pulse 93   Ht 5' 2.5" (1.588 m)   Wt 176 lb 12.8 oz (80.2 kg)   SpO2 95%   BMI 31.82 kg/m   Wt Readings from Last 5 Encounters:  08/12/23 176 lb 12.8 oz (80.2 kg)  07/27/23 179 lb (81.2 kg)  01/22/23 190 lb 12.8 oz (86.5 kg)  10/16/22 185 lb 9.6 oz (84.2 kg)  07/22/22 178 lb (80.7 kg)    BMI Readings from Last 5 Encounters:  08/12/23 31.82 kg/m  07/27/23 32.22 kg/m  01/22/23 32.75 kg/m  10/16/22 31.86 kg/m  07/22/22 30.55 kg/m     Physical Exam General: Well-appearing, sitting in chair Eyes: EOMI, no icterus Neck: Supple, no JVP Pulmonary: Clear, no wheeze Cardiovascular: Warm, no edema Abdomen: Nondistended, bowel sounds present MSK: No synovitis, no joint effusion Neuro: Normal gait, no weakness Psych: Normal mood, full  affect   Assessment & Plan:   Dyspnea on exertion and cough largely related to asthma:  EMR indicates it has been a problem dating back to at least 2022.  Improved with albuterol nebulized.  Some improved with prednisone.  Further temporary improvement with high-dose ICS/LABA therapy via Advair discus.  PFTs confirmed presence of hyperinflation and air trapping.  Further improvement with high-dose Trelegy but now increased to Symbicort.  Not covered by Medicaid.  Nucala added 11/2022 given uncontrolled symptoms, frequent albuterol use.  Marked improvement in symptoms with addition of Nucala.  Still with ongoing cough related to asthma.  Continue Symbicort.  Add Qvar.  Will pursue a prior Auth for Trelegy given marked improvement in symptoms on this in the past.  Sleep disordered breathing: Concern for intermittent low oxygen at night on her watch, isolated event but still reported.  Also been told she stops breathing in the past.  Split night study result not back yet.  She states he was not placed on CPAP which maybe is encouraging that OSA is not present but cannot tell until sleep test is interpreted.  Return in about 6 months (around 02/10/2024) for f/u Dr. Judeth Horn.   Karren Burly, MD 08/12/2023

## 2023-08-12 NOTE — Telephone Encounter (Signed)
Can we please pursue a prior Auth for Trelegy 200 mcg dose.  Patient has severe asthma.  Uncontrolled symptoms despite Symbicort therapy.  Symptoms were markedly better controlled on Trelegy.  I would like for her to be on Trelegy as it had much improvement in cough and phlegm production.  Thank you.

## 2023-08-14 ENCOUNTER — Other Ambulatory Visit: Payer: Self-pay

## 2023-08-14 ENCOUNTER — Other Ambulatory Visit (HOSPITAL_COMMUNITY): Payer: Self-pay

## 2023-08-14 MED ORDER — WEGOVY 0.25 MG/0.5ML ~~LOC~~ SOAJ
0.2500 mg | SUBCUTANEOUS | 2 refills | Status: DC
  Filled 2023-08-14 – 2023-08-15 (×2): qty 2, 28d supply, fill #0

## 2023-08-15 ENCOUNTER — Other Ambulatory Visit (HOSPITAL_COMMUNITY): Payer: Self-pay

## 2023-08-20 ENCOUNTER — Other Ambulatory Visit (HOSPITAL_COMMUNITY): Payer: Self-pay

## 2023-08-20 NOTE — Telephone Encounter (Signed)
Pharmacy Patient Advocate Encounter  Received notification from [facility] that Prior Authorization for Trelegy 200 has been APPROVED from 08/20/2023 to 08/19/2024. Ran test claim, Copay is $4.00. This test claim was processed through [facility]- copay amounts may vary at other pharmacies due to pharmacy/plan contracts, or as the patient moves through the different stages of their insurance plan.

## 2023-08-20 NOTE — Telephone Encounter (Signed)
Pharmacy Patient Advocate Encounter  Questions generated, answered, and submitted

## 2023-08-29 ENCOUNTER — Other Ambulatory Visit: Payer: Self-pay

## 2023-08-29 NOTE — Progress Notes (Signed)
Specialty Pharmacy Refill Coordination Note  Meagan Ramos is a 47 y.o. female contacted today regarding refills of specialty medication(s) Mepolizumab   Patient requested Delivery   Delivery date: 09/10/23   Verified address: [address] [place] [address]   Medication will be filled on 09/09/23.

## 2023-08-30 DIAGNOSIS — G4730 Sleep apnea, unspecified: Secondary | ICD-10-CM | POA: Diagnosis not present

## 2023-08-30 NOTE — Procedures (Signed)
Patient Name: Meagan Ramos, Full Date: 07/27/2023 Gender: Female D.O.B: 28-May-1976 Age (years): 42 Referring Provider: Vilma Meckel MD Height (inches): 63 Interpreting Physician: Jetty Duhamel MD, ABSM Weight (lbs): 179 RPSGT: [place], [place] BMI: 32 MRN: [id number] Neck Size: 13.00  CLINICAL INFORMATION Sleep Study Type: NPSG Indication for sleep study: Daytime Fatigue, Fatigue, Snoring Epworth Sleepiness Score: 20  SLEEP STUDY TECHNIQUE As per the AASM Manual for the Scoring of Sleep and Associated Events v2.3 (April 2016) with a hypopnea requiring 4% desaturations.  The channels recorded and monitored were frontal, central and occipital EEG, electrooculogram (EOG), submentalis EMG (chin), nasal and oral airflow, thoracic and abdominal wall motion, anterior tibialis EMG, snore microphone, electrocardiogram, and pulse oximetry.  MEDICATIONS Medications self-administered by patient taken the night of the study : clonazepam 1 mg, buspirone 10 mg. Suboxone taken before arrival.  SLEEP ARCHITECTURE The study was initiated at 9:57:34 PM and ended at 4:47:12 AM.  Sleep onset time was 20.7 minutes and the sleep efficiency was 91.9%. The total sleep time was 376.5 minutes.  Stage REM latency was 108.5 minutes.  The patient spent 3.72% of the night in stage N1 sleep, 83.27% in stage N2 sleep, 0.00% in stage N3 and 13% in REM.  Alpha intrusion was present.  Supine sleep was 100.00%.  RESPIRATORY PARAMETERS The overall apnea/hypopnea index (AHI) was 0.3 per hour. There were 1 total apneas, including 0 obstructive, 1 central and 0 mixed apneas. There were 1 hypopneas and 4 RERAs.  The AHI during Stage REM sleep was 2.4 per hour.  AHI while supine was 0.3 per hour.  The mean oxygen saturation was 88.72%. The minimum SpO2 during sleep was 85.00%.  soft snoring was noted during this study.  CARDIAC DATA The 2 lead EKG demonstrated sinus rhythm. The mean heart rate was  62.33 beats per minute. Other EKG findings include: None.  LEG MOVEMENT DATA The total PLMS were 0 with a resulting PLMS index of 0.00. Associated arousal with leg movement index was 0.0 .  IMPRESSIONS - No significant obstructive sleep apnea occurred during this study (AHI = 0.3/h). - No significant central sleep apnea occurred during this study (CAI = 0.2/h). - Sustained oxygen desaturation was noted during this study (Min O2 = 85.00%, Mean 88.7%). Time with O2 saturation 88% or less was 118 minutes. Tech reported borderline criteria for addition of supplemental O2- not done. - The patient snored with soft snoring volume. - No cardiac abnormalities were noted during this study. - Clinically significant periodic limb movements did not occur during sleep. No significant associated arousals. - EEG abnormal, making sleep-stage identification difficult. Tech described fast alpha activity, alpha intrusion, beta spindles.  - Patient slept with tablet and TV for light andd background noise.  DIAGNOSIS - Nocturnal Hypoxemia (G47.36) - Abnormal EEG  RECOMMENDATIONS - Abnormal EEG may represent medication effect. Suggest Neurology assessment if appropriate. - Assess need for supplemental O2. - Sleep hygiene should be reviewed to assess factors that may improve sleep quality. - Weight management and regular exercise should be initiated or continued if appropriate.  [Electronically signed] 08/30/2023 12:24 PM  Jetty Duhamel MD, ABSM Diplomate, American Board of Sleep Medicine NPI: [id number]                          Jetty Duhamel Diplomate, American Board of Sleep Medicine  ELECTRONICALLY SIGNED ON:  08/30/2023, 12:09 PM [facility] PH: [phone]   FX: [phone]  ACCREDITED BY THE AMERICAN ACADEMY OF SLEEP MEDICINE

## 2023-09-03 ENCOUNTER — Ambulatory Visit
Admission: EM | Admit: 2023-09-03 | Discharge: 2023-09-03 | Disposition: A | Discharge status: Home / Self Care | Payer: Medicaid Other | Attending: Internal Medicine | Admitting: Internal Medicine

## 2023-09-03 DIAGNOSIS — J4521 Mild intermittent asthma with (acute) exacerbation: Secondary | ICD-10-CM | POA: Diagnosis not present

## 2023-09-03 DIAGNOSIS — J069 Acute upper respiratory infection, unspecified: Secondary | ICD-10-CM | POA: Diagnosis not present

## 2023-09-03 MED ORDER — BENZONATATE 100 MG PO CAPS: 100.0000 mg | ORAL_CAPSULE | Freq: Three times a day (TID) | ORAL | 0 refills | Status: AC | PRN

## 2023-09-03 MED ORDER — DEXAMETHASONE SODIUM PHOSPHATE 10 MG/ML IJ SOLN
10.0000 mg | Freq: Once | INTRAMUSCULAR | Status: AC
  Administered 2023-09-03: 10 mg via INTRAMUSCULAR

## 2023-09-03 MED ORDER — IPRATROPIUM-ALBUTEROL 0.5-2.5 (3) MG/3ML IN SOLN
3.0000 mL | Freq: Once | RESPIRATORY_TRACT | Status: AC
  Administered 2023-09-03: 3 mL via RESPIRATORY_TRACT

## 2023-09-03 NOTE — ED Triage Notes (Signed)
Patient presents with sore throat, states she can barely breathe, cough, x 5. At home covid test negative. Treated Tylenol cold and flu and antibiotics.

## 2023-09-03 NOTE — ED Provider Notes (Signed)
[name]    CSN: [id number] Arrival date & time: 09/03/23  0808      History   Chief Complaint No chief complaint on file.   HPI Meagan Ramos is a 47 y.o. female.   Patient presents with sore throat, nasal congestion, cough that started about 4-5 days ago.  Sore throat is now resolved per patient report.  Denies any fever at home.  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  She has taken Mucinex, DayQuil, Tylenol Cold and flu for symptoms with minimal improvement.  She does have a history of asthma where she has a chronic cough but reports cough and symptoms have worsened over the past few days.  She has been using albuterol inhaler and nebulizer treatment with minimal improvement.  She reports that she does have some shortness of breath.  She also uses a daily inhaler but is not sure the name of it. Has taken a negative covid test at home.      History reviewed. No pertinent past medical history.  Patient Active Problem List   Diagnosis Date Noted   Cholecystitis 06/03/2020   Pap smear for cervical cancer screening 01/05/2015   Chronic cough 12/16/2014   Chronic left hip pain 12/16/2014   History of opioid abuse (HCC) 12/16/2014   Tobacco abuse 12/16/2014   Family history of thyroid disease 12/16/2014    Past Surgical History:  Procedure Laterality Date   CHOLECYSTECTOMY N/A 06/04/2020   Procedure: LAPAROSCOPIC CHOLECYSTECTOMY;  Surgeon: Berna Bue, MD;  Location: [facility] OR;  Service: General;  Laterality: N/A;   MANDIBLE SURGERY     TUBAL LIGATION      OB History   No obstetric history on file.      Home Medications    Prior to Admission medications   Medication Sig Start Date End Date Taking? Authorizing Provider  benzonatate (TESSALON) 100 MG capsule Take 1 capsule (100 mg total) by mouth every 8 (eight) hours as needed for cough. 09/03/23  Yes Thia Olesen, Rolly Salter E, FNP  albuterol (PROVENTIL)  (2.5 MG/3ML) 0.083% nebulizer solution Take 3 mLs (2.5 mg total) by nebulization every 4 (four) hours as needed for wheezing or shortness of breath. 06/21/22   Hunsucker, Lesia Sago, MD  albuterol (VENTOLIN HFA) 108 (90 Base) MCG/ACT inhaler INHALE 2 PUFFS BY MOUTH EVERY 4 HOURS AS NEEDED FOR WHEEZING OR SHORTNESS OF BREATH 06/19/23   Hunsucker, Lesia Sago, MD  beclomethasone (QVAR REDIHALER) 80 MCG/ACT inhaler Inhale 2 puffs into the lungs 2 (two) times daily. 08/12/23   Hunsucker, Lesia Sago, MD  budesonide-formoterol (SYMBICORT) 160-4.5 MCG/ACT inhaler Inhale 2 puffs into the lungs 2 (two) times daily. 04/17/23   Hunsucker, Lesia Sago, MD  buprenorphine-naloxone (SUBOXONE) 8-2 mg SUBL SL tablet Place 1 tablet under the tongue in the morning, at noon, and at bedtime.    [provider]  clonazePAM (KLONOPIN) 1 MG tablet Take 1 mg by mouth 3 (three) times daily as needed for anxiety.    [provider]  Mepolizumab (NUCALA) 100 MG/ML SOAJ Inject 1 mL (100 mg total) into the skin every 28 (twenty-eight) days. 05/07/23   Hunsucker, Lesia Sago, MD  nystatin (MYCOSTATIN) 100000 UNIT/ML suspension Take 5 mLs (500,000 Units total) by mouth 4 (four) times daily. 11/08/22   Hunsucker, Lesia Sago, MD  Semaglutide-Weight Management (WEGOVY) 0.25 MG/0.5ML SOAJ Inject 0.25 mg into the skin once a week. 08/14/23  Family History Family History  Problem Relation Age of Onset   Cancer Mother        thyroid cancer    Hypertension Mother    Cancer Father        lung cancer    Hypertension Maternal Grandmother    Cancer Maternal Grandfather        lung cancer     Social History Social History   Tobacco Use   Smoking status: Every Day    Current packs/day: 0.50    Average packs/day: 0.5 packs/day for 23.0 years (11.5 ttl pk-yrs)    Types: Cigarettes   Smokeless tobacco: Never   Tobacco comments:    Pt states that she smoke 1/2 ppd. ALS 7/27  Substance Use Topics   Alcohol use: No     Alcohol/week: 0.0 standard drinks of alcohol   Drug use: Not Currently     Allergies   Penicillins   Review of Systems Review of Systems Per HPI  Physical Exam Triage Vital Signs ED Triage Vitals  Encounter Vitals Group     BP 09/03/23 0819 108/75     Systolic BP Percentile --      Diastolic BP Percentile --      Pulse Rate 09/03/23 0819 (!) 113     Resp 09/03/23 0819 18     Temp 09/03/23 0819 97.9 F (36.6 C)     Temp Source 09/03/23 0819 Oral     SpO2 09/03/23 0819 96 %     Weight 09/03/23 0825 172 lb (78 kg)     Height 09/03/23 0825 5' 2.5" (1.588 m)     Head Circumference --      Peak Flow --      Pain Score 09/03/23 0825 3     Pain Loc --      Pain Education --      Exclude from Growth Chart --    No data found.  Updated Vital Signs BP 108/75 (BP Location: Left Arm)   Pulse (!) 113   Temp 97.9 F (36.6 C) (Oral)   Resp 18   Ht 5' 2.5" (1.588 m)   Wt 172 lb (78 kg)   LMP 01/10/2023 (Approximate)   SpO2 96%   BMI 30.96 kg/m   Visual Acuity Right Eye Distance:   Left Eye Distance:   Bilateral Distance:    Right Eye Near:   Left Eye Near:    Bilateral Near:     Physical Exam Constitutional:      General: She is not in acute distress.    Appearance: Normal appearance. She is not toxic-appearing or diaphoretic.  HENT:     Head: Normocephalic and atraumatic.     Right Ear: Tympanic membrane and ear canal normal.     Left Ear: Tympanic membrane and ear canal normal.     Nose: Congestion present.     Mouth/Throat:     Mouth: Mucous membranes are moist.     Pharynx: No posterior oropharyngeal erythema.  Eyes:     Extraocular Movements: Extraocular movements intact.     Conjunctiva/sclera: Conjunctivae normal.     Pupils: Pupils are equal, round, and reactive to light.  Cardiovascular:     Rate and Rhythm: Normal rate and regular rhythm.     Pulses: Normal pulses.     Heart sounds: Normal heart sounds.  Pulmonary:     Effort: Pulmonary effort  is normal. No respiratory distress.     Breath sounds: No stridor. Wheezing  present. No rhonchi or rales.     Comments: Wheezing noted bilaterally to auscultation. Abdominal:     General: Abdomen is flat. Bowel sounds are normal.     Palpations: Abdomen is soft.  Musculoskeletal:        General: Normal range of motion.     Cervical back: Normal range of motion.  Skin:    General: Skin is warm and dry.  Neurological:     General: No focal deficit present.     Mental Status: She is alert and oriented to person, place, and time. Mental status is at baseline.  Psychiatric:        Mood and Affect: Mood normal.        Behavior: Behavior normal.      UC Treatments / Results  Labs (all labs ordered are listed, but only abnormal results are displayed) Labs Reviewed - No data to display  EKG   Radiology No results found.  Procedures Procedures (including critical care time)  Medications Ordered in UC Medications  ipratropium-albuterol (DUONEB) 0.5-2.5 (3) MG/3ML nebulizer solution 3 mL (3 mLs Nebulization Given 09/03/23 0921)  dexamethasone (DECADRON) injection 10 mg (10 mg Intramuscular Given 09/03/23 0915)    Initial Impression / Assessment and Plan / UC Course  I have reviewed the triage vital signs and the nursing notes.  Pertinent labs & imaging results that were available during my care of the patient were reviewed by me and considered in my medical decision making (see chart for details).     Suspect patient is having a viral upper respiratory infection that is causing asthma exacerbation.  DuoNeb was administered along with IM Decadron in urgent care today with patient having significant improvement in symptoms and lung sounds.  Patient also stated that she felt much better.  Oxygen remained stable as well so patient is safe for discharge.  COVID testing deferred given negative COVID test at home.  Flu testing deferred given duration of symptoms as it would not change  treatment.  Patient did not want to take prednisone pills but was open to IM steroid today in urgent care.  Benzonatate prescribed to take as needed for cough.  Encouraged patient to continue nebulizer treatments at home and follow-up if symptoms persist or worsen.  Also advised strict ER precautions.  Patient verbalized understanding and was agreeable with plan. Final Clinical Impressions(s) / UC Diagnoses   Final diagnoses:  Viral upper respiratory tract infection with cough  Mild intermittent asthma with acute exacerbation     Discharge Instructions      Suspect viral cause to your symptoms causing a flareup of your asthma.  I have prescribed cough medication.  Continue breathing treatments at home.  Follow-up if any symptoms persist or worsen.     ED Prescriptions     Medication Sig Dispense Auth. Provider   benzonatate (TESSALON) 100 MG capsule Take 1 capsule (100 mg total) by mouth every 8 (eight) hours as needed for cough. 21 capsule [place], Acie Fredrickson, [place]      PDMP not reviewed this encounter.   Gustavus Bryant, [place] 09/03/23 [phone]

## 2023-09-03 NOTE — Discharge Instructions (Signed)
Suspect viral cause to your symptoms causing a flareup of your asthma.  I have prescribed cough medication.  Continue breathing treatments at home.  Follow-up if any symptoms persist or worsen.

## 2023-09-04 MED ORDER — TRELEGY ELLIPTA 200-62.5-25 MCG/ACT IN AEPB: 1.0000 | INHALATION_SPRAY | Freq: Every day | RESPIRATORY_TRACT | 11 refills | Status: DC

## 2023-09-04 NOTE — Telephone Encounter (Signed)
Was approved for Trelegy program. See signed encounter. Please call in to [facility]. PT is sick and stuffed up and really needs it.

## 2023-09-04 NOTE — Telephone Encounter (Signed)
I called and spoke with the pt and notified rx for trelegy has been sent to her preferred pharm  Nothing further needed

## 2023-09-17 ENCOUNTER — Other Ambulatory Visit (HOSPITAL_COMMUNITY): Payer: Self-pay

## 2023-09-17 DIAGNOSIS — R7303 Prediabetes: Secondary | ICD-10-CM | POA: Insufficient documentation

## 2023-09-17 MED ORDER — ONDANSETRON HCL 8 MG PO TABS
8.0000 mg | ORAL_TABLET | Freq: Three times a day (TID) | ORAL | 0 refills | Status: DC | PRN
  Filled 2023-09-17 – 2023-09-30 (×2): qty 20, 7d supply, fill #0

## 2023-09-17 MED ORDER — WEGOVY 0.25 MG/0.5ML ~~LOC~~ SOAJ
0.2500 mg | SUBCUTANEOUS | 2 refills | Status: DC
  Filled 2023-09-17 – 2023-09-30 (×2): qty 2, 28d supply, fill #0

## 2023-09-18 ENCOUNTER — Other Ambulatory Visit (HOSPITAL_COMMUNITY): Payer: Self-pay

## 2023-09-19 ENCOUNTER — Other Ambulatory Visit (HOSPITAL_COMMUNITY): Payer: Self-pay

## 2023-09-19 MED ORDER — DOXYCYCLINE HYCLATE 100 MG PO TABS
100.0000 mg | ORAL_TABLET | Freq: Two times a day (BID) | ORAL | 0 refills | Status: AC
  Filled 2023-09-19: qty 14, 7d supply, fill #0

## 2023-09-29 ENCOUNTER — Other Ambulatory Visit (HOSPITAL_COMMUNITY): Payer: Self-pay

## 2023-09-30 ENCOUNTER — Other Ambulatory Visit (HOSPITAL_COMMUNITY): Payer: Self-pay

## 2023-10-02 ENCOUNTER — Other Ambulatory Visit: Payer: Self-pay

## 2023-10-02 ENCOUNTER — Other Ambulatory Visit: Payer: Self-pay | Admitting: Pulmonary Disease

## 2023-10-02 ENCOUNTER — Other Ambulatory Visit (HOSPITAL_COMMUNITY): Payer: Self-pay

## 2023-10-02 DIAGNOSIS — J4550 Severe persistent asthma, uncomplicated: Secondary | ICD-10-CM

## 2023-10-02 NOTE — Progress Notes (Signed)
Specialty Pharmacy Refill Coordination Note  Meagan Ramos is a 47 y.o. female contacted today regarding refills of specialty medication(s) Mepolizumab   Patient requested Delivery   Delivery date: 10/15/23   Verified address: [address] [place] [address]   Medication will be filled on 10/14/23.  Refill request pending

## 2023-10-03 ENCOUNTER — Other Ambulatory Visit (HOSPITAL_COMMUNITY): Payer: Self-pay

## 2023-10-03 ENCOUNTER — Other Ambulatory Visit: Payer: Self-pay

## 2023-10-03 MED ORDER — NUCALA 100 MG/ML ~~LOC~~ SOAJ
100.0000 mg | SUBCUTANEOUS | 5 refills | Status: DC
  Filled 2023-10-03: qty 1, 28d supply, fill #0
  Filled 2023-11-04: qty 1, 28d supply, fill #1
  Filled 2023-12-05: qty 1, 28d supply, fill #2
  Filled 2024-01-05: qty 1, 28d supply, fill #3
  Filled 2024-01-29: qty 1, 28d supply, fill #4
  Filled 2024-03-01 – 2024-04-26 (×2): qty 1, 28d supply, fill #5

## 2023-10-03 NOTE — Telephone Encounter (Signed)
Refill sent for [facility] to [facility]: [phone]   Dose: 100mg  SQ every 4 weeks  Last OV: 08/12/23 Provider: Dr. Judeth Horn  Routing to scheduling team for follow-up on appt scheduling  Chesley Mires, PharmD, MPH, BCPS Clinical Pharmacist (Rheumatology and Pulmonology)

## 2023-10-14 ENCOUNTER — Other Ambulatory Visit: Payer: Self-pay

## 2023-10-16 ENCOUNTER — Other Ambulatory Visit (HOSPITAL_COMMUNITY): Payer: Self-pay

## 2023-10-16 MED ORDER — WEGOVY 0.5 MG/0.5ML ~~LOC~~ SOAJ
0.5000 mg | SUBCUTANEOUS | 1 refills | Status: DC
  Filled 2023-10-16: qty 2, 28d supply, fill #0
  Filled 2023-10-17: qty 2, fill #0
  Filled 2023-10-18 – 2023-10-28 (×3): qty 2, 28d supply, fill #0

## 2023-10-18 ENCOUNTER — Other Ambulatory Visit (HOSPITAL_BASED_OUTPATIENT_CLINIC_OR_DEPARTMENT_OTHER): Payer: Self-pay

## 2023-10-18 ENCOUNTER — Other Ambulatory Visit (HOSPITAL_COMMUNITY): Payer: Self-pay

## 2023-10-18 ENCOUNTER — Encounter (HOSPITAL_COMMUNITY): Payer: Self-pay

## 2023-10-20 ENCOUNTER — Other Ambulatory Visit (HOSPITAL_COMMUNITY): Payer: Self-pay

## 2023-10-21 ENCOUNTER — Other Ambulatory Visit: Payer: Self-pay

## 2023-10-21 ENCOUNTER — Other Ambulatory Visit (HOSPITAL_COMMUNITY): Payer: Self-pay

## 2023-10-22 ENCOUNTER — Other Ambulatory Visit: Payer: Self-pay

## 2023-10-28 ENCOUNTER — Other Ambulatory Visit (HOSPITAL_COMMUNITY): Payer: Self-pay

## 2023-10-28 ENCOUNTER — Other Ambulatory Visit: Payer: Self-pay

## 2023-10-29 ENCOUNTER — Other Ambulatory Visit (HOSPITAL_COMMUNITY): Payer: Self-pay

## 2023-10-30 ENCOUNTER — Other Ambulatory Visit: Payer: Self-pay

## 2023-10-30 ENCOUNTER — Encounter (HOSPITAL_COMMUNITY): Payer: Self-pay | Admitting: Emergency Medicine

## 2023-10-30 ENCOUNTER — Emergency Department (HOSPITAL_COMMUNITY)
Admission: EM | Admit: 2023-10-30 | Discharge: 2023-10-31 | Discharge status: Against Medical Advice | Payer: No Typology Code available for payment source | Attending: Emergency Medicine | Admitting: Emergency Medicine

## 2023-10-30 ENCOUNTER — Emergency Department (HOSPITAL_COMMUNITY): Payer: No Typology Code available for payment source

## 2023-10-30 DIAGNOSIS — M25571 Pain in right ankle and joints of right foot: Secondary | ICD-10-CM | POA: Diagnosis not present

## 2023-10-30 DIAGNOSIS — M79641 Pain in right hand: Secondary | ICD-10-CM | POA: Insufficient documentation

## 2023-10-30 DIAGNOSIS — R079 Chest pain, unspecified: Secondary | ICD-10-CM | POA: Diagnosis not present

## 2023-10-30 DIAGNOSIS — M542 Cervicalgia: Secondary | ICD-10-CM | POA: Insufficient documentation

## 2023-10-30 DIAGNOSIS — Z5321 Procedure and treatment not carried out due to patient leaving prior to being seen by health care provider: Secondary | ICD-10-CM | POA: Insufficient documentation

## 2023-10-30 LAB — CBC
HCT: 40.8 % (ref 36.0–46.0)
Hemoglobin: 13.2 g/dL (ref 12.0–15.0)
MCH: 28.4 pg (ref 26.0–34.0)
MCHC: 32.4 g/dL (ref 30.0–36.0)
MCV: 87.9 fL (ref 80.0–100.0)
Platelets: 270 10*3/uL (ref 150–400)
RBC: 4.64 MIL/uL (ref 3.87–5.11)
RDW: 13.6 % (ref 11.5–15.5)
WBC: 10 10*3/uL (ref 4.0–10.5)
nRBC: 0 % (ref 0.0–0.2)

## 2023-10-30 LAB — I-STAT CHEM 8, ED
BUN: 9 mg/dL (ref 6–20)
Calcium, Ion: 1.17 mmol/L (ref 1.15–1.40)
Chloride: 104 mmol/L (ref 98–111)
Creatinine, Ser: 0.6 mg/dL (ref 0.44–1.00)
Glucose, Bld: 85 mg/dL (ref 70–99)
HCT: 40 % (ref 36.0–46.0)
Hemoglobin: 13.6 g/dL (ref 12.0–15.0)
Potassium: 3.7 mmol/L (ref 3.5–5.1)
Sodium: 139 mmol/L (ref 135–145)
TCO2: 24 mmol/L (ref 22–32)

## 2023-10-30 LAB — URINALYSIS, ROUTINE W REFLEX MICROSCOPIC
Bilirubin Urine: NEGATIVE
Glucose, UA: NEGATIVE mg/dL
Hgb urine dipstick: NEGATIVE
Ketones, ur: NEGATIVE mg/dL
Nitrite: NEGATIVE
Protein, ur: NEGATIVE mg/dL
Specific Gravity, Urine: 1.014 (ref 1.005–1.030)
pH: 6 (ref 5.0–8.0)

## 2023-10-30 LAB — COMPREHENSIVE METABOLIC PANEL
ALT: 15 U/L (ref 0–44)
AST: 23 U/L (ref 15–41)
Albumin: 3.8 g/dL (ref 3.5–5.0)
Alkaline Phosphatase: 88 U/L (ref 38–126)
Anion gap: 10 (ref 5–15)
BUN: 7 mg/dL (ref 6–20)
CO2: 23 mmol/L (ref 22–32)
Calcium: 9.4 mg/dL (ref 8.9–10.3)
Chloride: 104 mmol/L (ref 98–111)
Creatinine, Ser: 0.71 mg/dL (ref 0.44–1.00)
GFR, Estimated: >60 mL/min (ref >60)
Glucose, Bld: 84 mg/dL (ref 70–99)
Potassium: 3.7 mmol/L (ref 3.5–5.1)
Sodium: 137 mmol/L (ref 135–145)
Total Bilirubin: 0.4 mg/dL (ref <1.2)
Total Protein: 7 g/dL (ref 6.5–8.1)

## 2023-10-30 LAB — ETHANOL: Alcohol, Ethyl (B): <10 mg/dL (ref <10)

## 2023-10-30 LAB — PROTIME-INR
INR: 1 (ref 0.8–1.2)
Prothrombin Time: 12.8 s (ref 11.4–15.2)

## 2023-10-30 LAB — PREGNANCY, URINE: Preg Test, Ur: NEGATIVE

## 2023-10-30 LAB — I-STAT CG4 LACTIC ACID, ED: Lactic Acid, Venous: 0.6 mmol/L (ref 0.5–1.9)

## 2023-10-30 MED ORDER — TETANUS-DIPHTH-ACELL PERTUSSIS 5-2.5-18.5 LF-MCG/0.5 IM SUSY: 0.5000 mL | PREFILLED_SYRINGE | Freq: Once | INTRAMUSCULAR | Status: DC

## 2023-10-30 MED ORDER — OXYCODONE-ACETAMINOPHEN 5-325 MG PO TABS
1.0000 | ORAL_TABLET | ORAL | Status: AC | PRN
  Administered 2023-10-30 – 2023-10-31 (×2): 1 via ORAL
  Filled 2023-10-30 (×2): qty 1
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
## 2023-10-30 NOTE — ED Notes (Signed)
1st lactic in normal range, 2nd not needed can be discontinued unless Dr says otherwise

## 2023-10-31 ENCOUNTER — Emergency Department (HOSPITAL_COMMUNITY): Payer: No Typology Code available for payment source

## 2023-10-31 MED ORDER — IOHEXOL 350 MG/ML SOLN
75.0000 mL | Freq: Once | INTRAVENOUS | Status: AC | PRN
Start: 2023-10-31 — End: 2023-10-31
  Administered 2023-10-31: 75 mL via INTRAVENOUS

## 2023-10-31 NOTE — ED Notes (Signed)
Pt left AMA °

## 2023-11-04 ENCOUNTER — Other Ambulatory Visit: Payer: Self-pay

## 2023-11-04 ENCOUNTER — Other Ambulatory Visit (HOSPITAL_COMMUNITY): Payer: Self-pay

## 2023-11-04 NOTE — Progress Notes (Signed)
Specialty Pharmacy Refill Coordination Note  Meagan Ramos is a 47 y.o. female contacted today regarding refills of specialty medication(s) Mepolizumab [name]   Patient requested Delivery   Delivery date: 11/14/23   Verified address: [address] [place] [address]   Medication will be filled on 11/13/23.

## 2023-11-07 ENCOUNTER — Telehealth: Payer: Self-pay

## 2023-11-07 NOTE — Telephone Encounter (Signed)
Received call from patient (ph# [phone]) who reports she spoke with another CM on yesterday who was working on providing her with a work note  (10/30/2023, 10/31/23)indicating when she was in the hospital. Per chart review no notes on file indicating patient spoke with a CM on yesterday, as patient left AMA. Patient reports she is headed to the hospital to pick up the work note. This RNCM unsure if the CM she spoke with left the letter at the front desk.   This RNCM will notify the other CM to advise of patient's request and phone number.   - 4:01pm This RNCM notified evening RNCM who will follow up with patient.

## 2023-11-10 ENCOUNTER — Other Ambulatory Visit: Payer: Self-pay

## 2023-11-10 ENCOUNTER — Ambulatory Visit
Admission: EM | Admit: 2023-11-10 | Discharge: 2023-11-10 | Disposition: A | Discharge status: Home / Self Care | Payer: Medicaid Other

## 2023-11-10 ENCOUNTER — Encounter: Payer: Self-pay | Admitting: Emergency Medicine

## 2023-11-10 DIAGNOSIS — M79671 Pain in right foot: Secondary | ICD-10-CM
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
## 2023-11-13 ENCOUNTER — Other Ambulatory Visit: Payer: Self-pay

## 2023-11-18 ENCOUNTER — Telehealth: Payer: Self-pay

## 2023-11-18 ENCOUNTER — Other Ambulatory Visit (HOSPITAL_COMMUNITY): Payer: Self-pay

## 2023-11-18 NOTE — Telephone Encounter (Signed)
 Pharmacy Patient Advocate Encounter   Received notification from CoverMyMeds that prior authorization for Trelegy Ellipta  200-62.5-25MCG/ACT aerosol powder is required/requested.   Insurance verification completed.   The patient is insured through [facility] .   Per test claim: The current 30 day co-pay is, $4.00.  No PA needed at this time. This test claim was processed through [facility]- copay amounts may vary at other pharmacies due to pharmacy/plan contracts, or as the patient moves through the different stages of their insurance plan.

## 2023-11-20 ENCOUNTER — Other Ambulatory Visit (HOSPITAL_COMMUNITY): Payer: Self-pay

## 2023-11-20 MED ORDER — WEGOVY 1 MG/0.5ML ~~LOC~~ SOAJ
1.0000 mg | SUBCUTANEOUS | 0 refills | Status: DC
  Filled 2023-11-20 – 2023-11-26 (×2): qty 2, 28d supply, fill #0

## 2023-11-26 ENCOUNTER — Other Ambulatory Visit (HOSPITAL_COMMUNITY): Payer: Self-pay

## 2023-11-26 ENCOUNTER — Other Ambulatory Visit: Payer: Self-pay

## 2023-12-05 ENCOUNTER — Other Ambulatory Visit: Payer: Self-pay

## 2023-12-05 NOTE — Progress Notes (Signed)
Specialty Pharmacy Refill Coordination Note  Meagan Ramos is a 48 y.o. female contacted today regarding refills of specialty medication(s) Mepolizumab [name]   Patient requested Delivery   Delivery date: 12/12/23   Verified address: [address] [place] [address]   Medication will be filled on 01.30.25.

## 2023-12-11 ENCOUNTER — Other Ambulatory Visit: Payer: Self-pay

## 2023-12-17 ENCOUNTER — Other Ambulatory Visit (HOSPITAL_COMMUNITY): Payer: Self-pay

## 2023-12-17 MED ORDER — WEGOVY 1 MG/0.5ML ~~LOC~~ SOAJ
0.5000 mL | SUBCUTANEOUS | 0 refills | Status: DC
  Filled 2023-12-17 – 2023-12-22 (×4): qty 2, 28d supply, fill #0

## 2023-12-19 ENCOUNTER — Other Ambulatory Visit (HOSPITAL_COMMUNITY): Payer: Self-pay

## 2023-12-22 ENCOUNTER — Other Ambulatory Visit (HOSPITAL_COMMUNITY): Payer: Self-pay

## 2023-12-22 ENCOUNTER — Other Ambulatory Visit: Payer: Self-pay

## 2023-12-23 ENCOUNTER — Other Ambulatory Visit (HOSPITAL_COMMUNITY): Payer: Self-pay

## 2023-12-27 ENCOUNTER — Ambulatory Visit: Payer: Self-pay

## 2023-12-31 ENCOUNTER — Other Ambulatory Visit: Payer: Self-pay

## 2024-01-05 ENCOUNTER — Other Ambulatory Visit (HOSPITAL_COMMUNITY): Payer: Self-pay

## 2024-01-05 NOTE — Progress Notes (Signed)
 Specialty Pharmacy Refill Coordination Note  Meagan Ramos is a 48 y.o. female contacted today regarding refills of specialty medication(s) Mepolizumab [name]   Patient requested Delivery   Delivery date: 01/08/24   Verified address: [address]  [place] [address]   Medication will be filled on 01/07/24.

## 2024-01-05 NOTE — Progress Notes (Signed)
 Specialty Pharmacy Ongoing Clinical Assessment Note  Meagan Ramos is a 48 y.o. female who is being followed by the specialty pharmacy service for RxSp Asthma/COPD   Patient's specialty medication(s) reviewed today: Mepolizumab (Nucala)   Missed doses in the last 4 weeks: 0   Patient/Caregiver did not have any additional questions or concerns.   Therapeutic benefit summary: Patient is achieving benefit   Adverse events/side effects summary: No adverse events/side effects   Patient's therapy is appropriate to: Continue    Goals Addressed             This Visit's Progress    Minimize recurrence of flares       Patient is on track. Patient will maintain adherence.  Patient reports that she is very well-controlled on therapy.          Follow up:  6 months  Servando Snare Specialty Pharmacist

## 2024-01-07 ENCOUNTER — Other Ambulatory Visit: Payer: Self-pay

## 2024-01-12 ENCOUNTER — Other Ambulatory Visit (HOSPITAL_COMMUNITY): Payer: Self-pay

## 2024-01-12 MED ORDER — WEGOVY 1.7 MG/0.75ML ~~LOC~~ SOAJ
1.7000 mg | SUBCUTANEOUS | 2 refills | Status: DC
Start: 2024-01-11 — End: 2024-05-03
  Filled 2024-01-12 – 2024-01-15 (×2): qty 3, 28d supply, fill #0
  Filled 2024-02-07: qty 3, 28d supply, fill #1
  Filled 2024-03-15 – 2024-04-26 (×2): qty 3, 28d supply, fill #2

## 2024-01-13 ENCOUNTER — Other Ambulatory Visit: Payer: Self-pay

## 2024-01-14 ENCOUNTER — Ambulatory Visit: Admitting: Pulmonary Disease

## 2024-01-14 ENCOUNTER — Encounter: Payer: Self-pay | Admitting: Pulmonary Disease

## 2024-01-14 ENCOUNTER — Other Ambulatory Visit: Payer: Self-pay

## 2024-01-14 VITALS — BP 106/75 | HR 91 | Ht 62.0 in | Wt 153.0 lb

## 2024-01-14 DIAGNOSIS — J4550 Severe persistent asthma, uncomplicated: Secondary | ICD-10-CM

## 2024-01-14 DIAGNOSIS — G4736 Sleep related hypoventilation in conditions classified elsewhere: Secondary | ICD-10-CM | POA: Diagnosis not present

## 2024-01-14 DIAGNOSIS — R0600 Dyspnea, unspecified: Secondary | ICD-10-CM

## 2024-01-14 MED ORDER — TRELEGY ELLIPTA 200-62.5-25 MCG/ACT IN AEPB: 1.0000 | INHALATION_SPRAY | Freq: Every day | RESPIRATORY_TRACT | 11 refills | Status: AC

## 2024-01-14 NOTE — Progress Notes (Signed)
 @Patient  ID: Meagan Ramos, female    DOB: [birth year], 48 y.o.   MRN: [id number]  Chief Complaint  Patient presents with   Follow-up    Pt states she losing insurance next month and also revisit the sleep study she had because her app. On phone says her O2 goes down to in the  70%    Referring provider: Corrington, Kip A, MD  HPI:   48 y.o. woman whom we are seeing in follow up for evaluation of DOE, cough felt to be related to poorly controlled asthma.  Multiple telephone encounters between clinical staff and pharmacist from pulmonary office in interim since last visit reviewed.  ED note x 2 in the interim reviewed.  Reviewed sleep study today.  Performed in the interim not yet resulted in the last met.  This showed no sleep apnea very low AHI.  Not oxygen desaturation to 85% at the lowest.  In the past her app and showed her desaturations to the 70s.  She was concerned about that.  Notably her sleep has improved with weight loss.  She is lost about 50 pounds actively.  Her sleep has improved with this.  We discussed at length likely hypoventilation related to weight worsened at night.  This showed likely has improved with weight loss and why her sleep has been improved.  We discussed pursuing open oximetry in the future if desired.  Currently, she has no desire to have oxygen at home.  Even if it were to be indicated.   HPI at initial visit: Patient reports shortness of breath for about 3 months.  Possibly just worsening over the last 3 months.  There is no dating back to at least 2022 for shortness of breath including ED visit.  That note was also reviewed.  At that time February 2022 chest x-ray on my read interpretation was clear.  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  Shortness of breath stable throughout the day.  Worsening with inclines or stairs.  No times a day  with things are better or worse.  No seasonal environmental factors she can identify that make things better or worse.  No position make things better or worse.  Uses albuterol sometimes when she cannot breathe well the inhaler does not work well.  However albuterol nebulizer grandmother's house is quite effective.  Also prescribed Flovent recently.  She does not feel like this helps at all.  No other alleviating or exacerbating factors.   PMH: Tobacco abuse Surgical history: Cholecystectomy, mandible surgery, tubal ligation Family history: Mother with thyroid cancer, hypertension, father with lung cancer Social history: 30-pack-year smoking history, down to half a pack a day more recently with worsening shortness of breath, works as Secondary school teacher / Pulmonary Flowsheets:   ACT:      No data to display          MMRC:     No data to display          Epworth:      No data to display          Tests:   FENO:  No results found for: "NITRICOXIDE"  PFT:    Latest Ref Rng & Units 07/22/2022   10:53 AM  PFT Results  FVC-Pre L 3.20   FVC-Predicted Pre % 87   FVC-Post L 3.37  FVC-Predicted Post % 92   Pre FEV1/FVC % % 81   Post FEV1/FCV % % 81   FEV1-Pre L 2.60   FEV1-Predicted Pre % 87   FEV1-Post L 2.73   DLCO uncorrected ml/min/mmHg 23.70   DLCO UNC% % 109   DLCO corrected ml/min/mmHg 23.70   DLCO COR %Predicted % 109   DLVA Predicted % 105   TLC L 6.15   TLC % Predicted % 121   RV % Predicted % 176   Personally reviewed and interpreted as normal spirometry without significant bronchodilator response.  Lung volumes indicate hyperinflation and air trapping.  DLCO within normal limits.  WALK:      No data to display          Imaging: Personally reviewed and as per EMR discussion this note No results found.  Lab Results: Personally reviewed CBC    Component Value Date/Time   WBC 10.0 10/30/2023 1948   RBC 4.64 10/30/2023  1948   HGB 13.2 10/30/2023 1948   HCT 40.8 10/30/2023 1948   PLT 270 10/30/2023 1948   MCV 87.9 10/30/2023 1948   MCH 28.4 10/30/2023 1948   MCHC 32.4 10/30/2023 1948   RDW 13.6 10/30/2023 1948   LYMPHSABS 3.3 11/19/2022 1502   MONOABS 0.7 11/19/2022 1502   EOSABS 0.3 11/19/2022 1502   BASOSABS 0.1 11/19/2022 1502    BMET    Component Value Date/Time   NA 137 10/30/2023 1948   K 3.7 10/30/2023 1948   CL 104 10/30/2023 1948   CO2 23 10/30/2023 1948   GLUCOSE 84 10/30/2023 1948   BUN 7 10/30/2023 1948   CREATININE 0.71 10/30/2023 1948   CREATININE 0.52 12/16/2014 1034   CALCIUM 9.4 10/30/2023 1948   GFRNONAA >60 10/30/2023 1948   GFRNONAA >89 12/16/2014 1034   GFRAA >60 06/05/2020 0435   GFRAA >89 12/16/2014 1034    BNP    Component Value Date/Time   BNP 49.4 12/26/2020 2153    ProBNP No results found for: "PROBNP"  Specialty Problems       Pulmonary Problems   Chronic cough   Cough   Severe persistent asthma without complication    Allergies  Allergen Reactions   Penicillins Rash and Other (See Comments)    Childhood allergy  Other Reaction(s): Not available    Immunization History  Administered Date(s) Administered   Hepatitis B, ADULT 01/03/2005   Influenza Inj Mdck Quad Pf 09/27/2021, 11/14/2022   Influenza,inj,Quad PF,6+ Mos 12/16/2014, 09/15/2018   Moderna Sars-Covid-2 Vaccination 04/18/2020, 05/16/2020   Pneumococcal Polysaccharide-23 12/16/2014   Tdap 07/01/2015, 12/21/2018    History reviewed. No pertinent past medical history.  Tobacco History: Social History   Tobacco Use  Smoking Status Every Day   Current packs/day: 0.50   Average packs/day: 0.5 packs/day for 23.0 years (11.5 ttl pk-yrs)   Types: Cigarettes  Smokeless Tobacco Never  Tobacco Comments   Pt states that she smoke 1/2 ppd. ALS 7/27   Ready to quit: Not Answered Counseling given: Not Answered Tobacco comments: Pt states that she smoke 1/2 ppd. ALS  7/27   Continue to not smoke  Outpatient Encounter Medications as of 01/14/2024  Medication Sig   albuterol (PROVENTIL) (2.5 MG/3ML) 0.083% nebulizer solution Take 3 mLs (2.5 mg total) by nebulization every 4 (four) hours as needed for wheezing or shortness of breath.   albuterol (VENTOLIN HFA) 108 (90 Base) MCG/ACT inhaler Inhale 2 puffs into the lungs every 6 (six) hours as needed for wheezing  or shortness of breath.   benzonatate (TESSALON) 100 MG capsule Take 1 capsule (100 mg total) by mouth every 8 (eight) hours as needed for cough.   buprenorphine-naloxone (SUBOXONE) 8-2 mg SUBL SL tablet Place 1 tablet under the tongue as directed.   busPIRone (BUSPAR) 10 MG tablet Take 10 mg by mouth at bedtime.   clonazePAM (KLONOPIN) 1 MG tablet Take 1 mg by mouth 3 (three) times daily as needed for anxiety.   clonazePAM (KLONOPIN) 1 MG tablet Take 1 mg by mouth 2 (two) times daily.   clonazePAM (KLONOPIN) 1 MG tablet Take 1 mg by mouth 2 (two) times daily.   Exenatide ER (BYDUREON BCISE) 2 MG/0.85ML AUIJ    fluconazole (DIFLUCAN) 100 MG tablet Take 100 mg by mouth as directed.   fluticasone (FLOVENT HFA) 110 MCG/ACT inhaler Inhale 1 puff into the lungs 2 (two) times daily.   HYDROcodone-acetaminophen (NORCO) 10-325 MG tablet Take 1-2 tablets by mouth every 6 (six) hours as needed.   Mepolizumab (NUCALA) 100 MG/ML SOAJ Inject 1 mL (100 mg total) into the skin every 28 (twenty-eight) days.   methocarbamol (ROBAXIN) 500 MG tablet Take 500 mg by mouth every 8 (eight) hours as needed.   naloxone (NARCAN) nasal spray 4 mg/0.1 mL Place 1 spray into the nose once.   nitrofurantoin, macrocrystal-monohydrate, (MACROBID) 100 MG capsule Take 1 capsule by mouth 2 (two) times daily.   nystatin (MYCOSTATIN) 100000 UNIT/ML suspension Take 5 mLs (500,000 Units total) by mouth 4 (four) times daily.   ondansetron (ZOFRAN) 8 MG tablet Take 1 tablet (8 mg total) by mouth every 8 (eight) hours as needed for Nausea for up  to 7 days.   oxyCODONE (OXY IR/ROXICODONE) 5 MG immediate release tablet Take 5 mg by mouth every 6 (six) hours as needed for severe pain (pain score 7-10) or breakthrough pain.   promethazine (PHENERGAN) 25 MG tablet Take 25 mg by mouth every 6 (six) hours as needed.   RESTASIS 0.05 % ophthalmic emulsion Place 1 drop into both eyes 2 (two) times daily.   Semaglutide-Weight Management (WEGOVY) 1.7 MG/0.75ML SOAJ Inject 1.7 mg into the skin once a week.   traMADol (ULTRAM) 50 MG tablet Take 50 mg by mouth every 6 (six) hours as needed.   [DISCONTINUED] albuterol (VENTOLIN HFA) 108 (90 Base) MCG/ACT inhaler INHALE 2 PUFFS BY MOUTH EVERY 4 HOURS AS NEEDED FOR WHEEZING OR SHORTNESS OF BREATH   [DISCONTINUED] beclomethasone (QVAR REDIHALER) 80 MCG/ACT inhaler Inhale 2 puffs into the lungs 2 (two) times daily.   [DISCONTINUED] budesonide-formoterol (SYMBICORT) 160-4.5 MCG/ACT inhaler Inhale 2 puffs into the lungs 2 (two) times daily.   [DISCONTINUED] budesonide-formoterol (SYMBICORT) 160-4.5 MCG/ACT inhaler Inhale 2 puffs into the lungs daily at 6 (six) AM.   [DISCONTINUED] budesonide-formoterol (SYMBICORT) 160-4.5 MCG/ACT inhaler Inhale 2 puffs into the lungs 2 (two) times daily.   [DISCONTINUED] budesonide-formoterol (SYMBICORT) 160-4.5 MCG/ACT inhaler Inhale 2 puffs into the lungs 2 (two) times daily.   [DISCONTINUED] Buprenorphine HCl-Naloxone HCl (SUBOXONE) 8-2 MG FILM    [DISCONTINUED] doxycycline (VIBRA-TABS) 100 MG tablet Take 100 mg by mouth as directed.   [DISCONTINUED] doxycycline (VIBRAMYCIN) 100 MG capsule Take 100 mg by mouth 2 (two) times daily.   [DISCONTINUED] fluticasone-salmeterol (ADVAIR) 500-50 MCG/ACT AEPB Inhale 1 puff into the lungs in the morning and at bedtime.   [DISCONTINUED] Fluticasone-Umeclidin-Vilant (TRELEGY ELLIPTA) 200-62.5-25 MCG/ACT AEPB Inhale 1 puff into the lungs daily.   [DISCONTINUED] nystatin (MYCOSTATIN) 100000 UNIT/ML suspension    [DISCONTINUED] ondansetron  (  ZOFRAN) 8 MG tablet Take by mouth.   [DISCONTINUED] predniSONE (DELTASONE) 10 MG tablet Take 10 mg by mouth as directed.   [DISCONTINUED] predniSONE (DELTASONE) 20 MG tablet Take 20 mg by mouth as directed.   Fluticasone-Umeclidin-Vilant (TRELEGY ELLIPTA) 200-62.5-25 MCG/ACT AEPB Inhale 1 puff into the lungs daily.   No facility-administered encounter medications on file as of 01/14/2024.     Review of Systems  Review of Systems  N/a  Physical Exam  BP 106/75 (BP Location: Left Arm, Patient Position: Sitting, Cuff Size: Normal)   Pulse 91   Ht 5\' 2"  (1.575 m)   Wt 153 lb (69.4 kg)   SpO2 95%   BMI 27.98 kg/m   Wt Readings from Last 5 Encounters:  01/14/24 153 lb (69.4 kg)  09/03/23 172 lb (78 kg)  08/12/23 176 lb 12.8 oz (80.2 kg)  07/27/23 179 lb (81.2 kg)  01/22/23 190 lb 12.8 oz (86.5 kg)    BMI Readings from Last 5 Encounters:  01/14/24 27.98 kg/m  09/03/23 30.96 kg/m  08/12/23 31.82 kg/m  07/27/23 32.22 kg/m  01/22/23 32.75 kg/m     Physical Exam General: Well-appearing, sitting in chair Eyes: EOMI, no icterus Neck: Supple, no JVP Pulmonary: Clear, no wheeze Cardiovascular: Warm, no edema Abdomen: Nondistended, bowel sounds present MSK: No synovitis, no joint effusion Neuro: Normal gait, no weakness Psych: Normal mood, full affect   Assessment & Plan:   Dyspnea on exertion and cough largely related to asthma:  EMR indicates it has been a problem dating back to at least 2022.  Improved with albuterol nebulized.  Some improved with prednisone.  Further temporary improvement with high-dose ICS/LABA therapy via Advair discus.  PFTs confirmed presence of hyperinflation and air trapping.   Nucala added 11/2022 given uncontrolled symptoms, frequent albuterol use.  Marked improvement in symptoms with addition of Nucala.  Well-controlled on Trelegy, refilled today.  Continue Nucala.  Nocturnal hypoxemia: Mild 85% fall 2024 on PSG.  She has had subsequent weight  loss.  Less awakenings.  Better sleep.  Discussed likely in setting of hypoventilation which can worsen related to obesity.  She is lost 50 pounds.  This is undoubtably improved.  Sleep has improved as well.  Consider repeat overnight oximetry in the future if desired.  We discussed that if oxygen were to drop that oxygen would be indicated.  Even if that was demonstrated she would not want oxygen at home at this time.  Can reconsider in the future.  Return in about 6 months (around 07/16/2024) for f/u Dr. Judeth Horn.   Karren Burly, MD 01/14/2024

## 2024-01-14 NOTE — Patient Instructions (Signed)
 Neck to see you again  No changes to medication  Trelegy refilled  Return to clinic in 6 months or sooner if needed with Dr. Judeth Horn

## 2024-01-15 ENCOUNTER — Other Ambulatory Visit (HOSPITAL_COMMUNITY): Payer: Self-pay

## 2024-01-27 ENCOUNTER — Other Ambulatory Visit: Payer: Self-pay

## 2024-01-27 LAB — COLOGUARD: COLOGUARD: NEGATIVE

## 2024-01-27 LAB — EXTERNAL GENERIC LAB PROCEDURE: COLOGUARD: NEGATIVE

## 2024-01-29 ENCOUNTER — Other Ambulatory Visit: Payer: Self-pay | Admitting: Pharmacy Technician

## 2024-01-29 ENCOUNTER — Other Ambulatory Visit: Payer: Self-pay

## 2024-01-29 NOTE — Progress Notes (Signed)
 Specialty Pharmacy Refill Coordination Note  Meagan Ramos is a 48 y.o. female contacted today regarding refills of specialty medication(s) Mepolizumab [name]   Patient requested Delivery   Delivery date: 02/05/24   Verified address: [address]  [place]   Medication will be filled on 02/04/24.

## 2024-02-04 ENCOUNTER — Other Ambulatory Visit: Payer: Self-pay

## 2024-02-07 ENCOUNTER — Other Ambulatory Visit (HOSPITAL_COMMUNITY): Payer: Self-pay

## 2024-02-08 ENCOUNTER — Other Ambulatory Visit (HOSPITAL_COMMUNITY): Payer: Self-pay

## 2024-02-09 ENCOUNTER — Other Ambulatory Visit (HOSPITAL_COMMUNITY): Payer: Self-pay

## 2024-02-09 ENCOUNTER — Other Ambulatory Visit: Payer: Self-pay

## 2024-02-09 ENCOUNTER — Ambulatory Visit: Admitting: Pulmonary Disease

## 2024-02-09 MED ORDER — ONDANSETRON HCL 8 MG PO TABS
8.0000 mg | ORAL_TABLET | Freq: Three times a day (TID) | ORAL | 0 refills | Status: AC | PRN
  Filled 2024-02-09: qty 20, 7d supply, fill #0

## 2024-02-18 ENCOUNTER — Ambulatory Visit: Payer: Medicaid Other | Admitting: Pulmonary Disease

## 2024-02-19 ENCOUNTER — Other Ambulatory Visit (HOSPITAL_COMMUNITY): Payer: Self-pay

## 2024-03-01 ENCOUNTER — Other Ambulatory Visit: Payer: Self-pay | Admitting: Pharmacy Technician

## 2024-03-01 ENCOUNTER — Other Ambulatory Visit: Payer: Self-pay

## 2024-03-01 NOTE — Progress Notes (Signed)
 Specialty Pharmacy Refill Coordination Note  Meagan Ramos is a 48 y.o. female contacted today regarding refills of specialty medication(s) Mepolizumab  (Nucala )   Patient requested Delivery   Delivery date: 03/09/24   Verified address: [address] [place]   Medication will be filled on 03/08/24.

## 2024-03-08 ENCOUNTER — Other Ambulatory Visit (HOSPITAL_COMMUNITY): Payer: Self-pay

## 2024-03-08 ENCOUNTER — Telehealth: Payer: Self-pay

## 2024-03-08 ENCOUNTER — Other Ambulatory Visit: Payer: Self-pay

## 2024-03-08 DIAGNOSIS — J4550 Severe persistent asthma, uncomplicated: Secondary | ICD-10-CM

## 2024-03-08 NOTE — Telephone Encounter (Signed)
 Received message from [place] at [facility] that pt's insurance has termed and nothing new comes up on eligibility check. Contacted pt to discuss, she confirms that she no longer has Medicaid because she has gotten a new job and is receiving insurance through them. Pt provided me with the information and I attempted to run a test claim, fully expecting it to be denied due to a PA requirement. The test claim adjudicated successfully, with a copay over $3,000. Began the enrollment process for the copay card program, however while the pt was searching for the name of the insurance company she read aloud the phrase "save up to 80% on prescriptions" and at that point I realized that the pt had been provided with a discount card through her insurance company rather than traditional prescription coverage.  Informed pt that she may technically qualify as "uninsured" and I would place an enrollment application for Gt[employer] in the mail for her to fill out. She verbalized understanding and appreciation.

## 2024-03-15 ENCOUNTER — Encounter (HOSPITAL_COMMUNITY): Payer: Self-pay

## 2024-03-15 ENCOUNTER — Other Ambulatory Visit (HOSPITAL_COMMUNITY): Payer: Self-pay

## 2024-03-16 ENCOUNTER — Ambulatory Visit: Payer: Self-pay | Admitting: Pulmonary Disease

## 2024-03-16 ENCOUNTER — Other Ambulatory Visit (HOSPITAL_COMMUNITY): Payer: Self-pay

## 2024-03-16 NOTE — Telephone Encounter (Signed)
 Chief Complaint: SOB Symptoms: SOB at baseline Frequency: x 3 days Pertinent Negatives: Patient denies fevers, URI sx, CP, severe SOB Disposition: [] ED /[] Urgent Care (no appt availability in office) / [] Appointment(In office/virtual)/ []  [facility]/ [] Home Care/ [] Refused Recommended Disposition /[] [facility]/ [x]  Follow-up with Pulm Additional Notes: Pt c/o SOB at baseline d/t not having maintenance INH x 3 days. Pt reports recently switching insurance and Fluticasone -Umeclidin-Vilant (TRELEGY ELLIPTA ) 200-62.5-25 MCG/ACT AEPB is not covered by insurance. Pt has been using albuterol  NEB in the interim. [facility] reinforced usage. Pt requesting sample of maintenance INH. Triager will forward encounter for Dr. Ames Justin 's office to review and advise. Patient verbalized understanding and is expecting call back from office for next steps.    Copied from CRM [id number]. Topic: Clinical - Red Word Triage >> Mar 16, 2024  9:42 AM Meagan Ramos wrote: Red Word that prompted transfer to Nurse Triage: Pt called to get a sample of trillegy from her provider, but she mentioned she is having shortness of breath for about 3 days since she has no trillegy left. Answer Assessment - Initial Assessment Questions E2C2 Pulmonary Triage - Initial Assessment Questions "Chief Complaint (e.g., cough, sob, wheezing, fever, chills, sweat or additional symptoms) *Go to specific symptom protocol after initial questions. SOB,  Pt reports last visit, new insurance does cover Rx  "How long have symptoms been present?" X 3 days  Have you tested for COVID or Flu? Note: If not, ask patient if a home test can be taken. If so, instruct patient to call back for positive results. No  MEDICINES:   "Have you used any OTC meds to help with symptoms?" No If yes, ask "What medications?"   "Have you used your inhalers/maintenance medication?" Yes If yes, "What medications?" Trelegy - last took 3 days ago -  reports insurance does not cover Albuterol  neb - last used this AM - reports provides some relief Qvar  - pt reports "doesn't work"  If inhaler, ask "How many puffs and how often?" Note: Review instructions on medication in the chart. See above  OXYGEN: "Do you wear supplemental oxygen?" No If yes, "How many liters are you supposed to use?" N/a  "Do you monitor your oxygen levels?" Yes If yes, "What is your reading (oxygen level) today?" 95% - which is "not bad for me"    1. RESPIRATORY STATUS: "Describe your breathing?" (e.g., wheezing, shortness of breath, unable to speak, severe coughing)      SOB 2. ONSET: "When did this breathing problem begin?"      X 3 days 3. PATTERN "Does the difficult breathing come and go, or has it been constant since it started?"      constant 4. SEVERITY: "How bad is your breathing?" (e.g., mild, moderate, severe)    - MILD: No SOB at rest, mild SOB with walking, speaks normally in sentences, can lie down, no retractions, pulse < 100.    - MODERATE: SOB at rest, SOB with minimal exertion and prefers to sit, cannot lie down flat, speaks in phrases, mild retractions, audible wheezing, pulse 100-120.    - SEVERE: Very SOB at rest, speaks in single words, struggling to breathe, sitting hunched forward, retractions, pulse > 120      Moderate - "I wake up with it" 5. RECURRENT SYMPTOM: "Have you had difficulty breathing before?" If Yes, ask: "When was the last time?" and "What happened that time?"      N/a 6. CARDIAC HISTORY: "Do you have  any history of heart disease?" (e.g., heart attack, angina, bypass surgery, angioplasty)      denies 7. LUNG HISTORY: "Do you have any history of lung disease?"  (e.g., pulmonary embolus, asthma, emphysema)     asthma 8. CAUSE: "What do you think is causing the breathing problem?"      Lack of medication 9. OTHER SYMPTOMS: "Do you have any other symptoms? (e.g., dizziness, runny nose, cough, chest pain, fever)      Cough Denies others  Answer Assessment - Initial Assessment Questions 1. DRUG NAME: "What medicine do you need to have refilled?"     Fluticasone -Umeclidin-Vilant (TRELEGY ELLIPTA ) 200-62.5-25 MCG/ACT AEPB 2. REFILLS REMAINING: "How many refills are remaining?" (Note: The label on the medicine or pill bottle will show how many refills are remaining. If there are no refills remaining, then a renewal may be needed.)     unknown 3. EXPIRATION DATE: "What is the expiration date?" (Note: The label states when the prescription will expire, and thus can no longer be refilled.)     unknown 4. PRESCRIBING HCP: "Who prescribed it?" Reason: If prescribed by specialist, call should be referred to that group.     [facility] 5. SYMPTOMS: "Do you have any symptoms?"     SOB  Protocols used: Breathing Difficulty-A-AH, Medication Refill and Renewal Call-A-AH

## 2024-03-16 NOTE — Telephone Encounter (Signed)
 Can you get these together for patient?

## 2024-03-16 NOTE — Telephone Encounter (Signed)
 Yes please provide up to 4 Trelegy 200 mcg dose samples - thank you! Can we see about manufacturing assistance paperwork?

## 2024-03-16 NOTE — Telephone Encounter (Signed)
**Note De-identified  [place] Obfuscation** Please advise 

## 2024-03-17 ENCOUNTER — Other Ambulatory Visit (HOSPITAL_COMMUNITY): Payer: Self-pay

## 2024-03-18 ENCOUNTER — Other Ambulatory Visit (HOSPITAL_COMMUNITY): Payer: Self-pay

## 2024-03-23 NOTE — Telephone Encounter (Signed)
 Spoke with patient to check if she had received [employer] enrollment application for NUCALA  assistance. Per patient she has not received this application. Confirmed correct address. We will re-send this application.   During conversation patient also expressed difficulty affording her inhaler therapies without insurance. She is relying on her rescue inhaler and is using a friend's maintenance inhaler prescription to get by. We will send a PAP application for Trelegy as well.   Tolu Toshiyuki Fredell, PharmD [facility] PGY-1

## 2024-03-24 ENCOUNTER — Other Ambulatory Visit: Payer: Self-pay

## 2024-03-26 ENCOUNTER — Other Ambulatory Visit: Payer: Self-pay

## 2024-04-02 ENCOUNTER — Telehealth: Payer: Self-pay | Admitting: Family Medicine

## 2024-04-02 MED ORDER — TRELEGY ELLIPTA 200-62.5-25 MCG/ACT IN AEPB: 1.0000 | INHALATION_SPRAY | Freq: Every day | RESPIRATORY_TRACT | Status: DC

## 2024-04-02 NOTE — Telephone Encounter (Signed)
 Copied from CRM [id number]. Topic: Clinical - Prescription Issue >> Apr 02, 2024 10:25 AM Meagan Ramos wrote: Reason for CRM: pt states Dr Marygrace Snellen told her if she needed any inhalers, he had samples and he would put up front so she could come pick up.  Pt lost her insurance. Pt needs the inhalers really bad. Fluticasone -Umeclidin-Vilant (TRELEGY ELLIPTA ) 200-62.5-25 MCG/ACT AEPB Please call pt when ready to p/u.

## 2024-04-02 NOTE — Telephone Encounter (Signed)
 Called and spoke with the pt I left her 4 boxes trelegy 200 and pt assistance forms at the front  She will pick up next wk  She is aware office closed 04/05/24

## 2024-04-27 ENCOUNTER — Other Ambulatory Visit (HOSPITAL_COMMUNITY): Payer: Self-pay

## 2024-04-27 ENCOUNTER — Other Ambulatory Visit: Payer: Self-pay

## 2024-04-29 ENCOUNTER — Other Ambulatory Visit (HOSPITAL_COMMUNITY): Payer: Self-pay

## 2024-04-29 NOTE — Telephone Encounter (Signed)
 Patient states that she has re-enrolled into Medicaid  Submitted an URGENT Prior Authorization request to [facility] for NUCALA  via CoverMyMeds. Will update once we receive a response.  Key: [place]illa Check

## 2024-04-30 ENCOUNTER — Other Ambulatory Visit: Payer: Self-pay

## 2024-04-30 MED ORDER — NUCALA 100 MG/ML ~~LOC~~ SOAJ
100.0000 mg | SUBCUTANEOUS | 3 refills | Status: DC
  Filled 2024-04-30: qty 1, 28d supply, fill #0
  Filled 2024-05-25: qty 1, 28d supply, fill #1
  Filled 2024-07-01: qty 1, 28d supply, fill #2
  Filled 2024-07-23 – 2024-08-02 (×2): qty 1, 28d supply, fill #3

## 2024-04-30 NOTE — Addendum Note (Signed)
 Addended by: Thais Fill on: 04/30/2024 12:26 PM   Modules accepted: Orders

## 2024-04-30 NOTE — Telephone Encounter (Signed)
 Received notification from [facility] regarding a prior authorization for NUCALA . Authorization has been APPROVED from 04/29/2024 to 04/29/2025. Approval letter sent to scan center.  Patient can continue to fill through [facility]: [phone]   Updated rx sent to [facility]. Unsure if she will need to be re-onbaorded  Geraldene Kleine, PharmD, MPH, BCPS, CPP Clinical Pharmacist (Rheumatology and Pulmonology)

## 2024-04-30 NOTE — Progress Notes (Signed)
 Specialty Pharmacy Refill Coordination Note  Meagan Ramos is a 48 y.o. female contacted today regarding refills of specialty medication(s) Mepolizumab  (Nucala )   Patient requested Delivery   Delivery date: 05/04/24   Verified address: [address]   Medication will be filled on 06.23.25.

## 2024-05-03 ENCOUNTER — Encounter (HOSPITAL_COMMUNITY): Payer: Self-pay

## 2024-05-03 ENCOUNTER — Other Ambulatory Visit: Payer: Self-pay

## 2024-05-03 ENCOUNTER — Other Ambulatory Visit (HOSPITAL_COMMUNITY): Payer: Self-pay

## 2024-05-03 MED ORDER — WEGOVY 1.7 MG/0.75ML ~~LOC~~ SOAJ
1.7000 mg | SUBCUTANEOUS | 2 refills | Status: DC
  Filled 2024-05-26: qty 3, 28d supply, fill #0
  Filled 2024-06-22: qty 3, 28d supply, fill #1
  Filled 2024-07-14: qty 3, 28d supply, fill #2

## 2024-05-04 ENCOUNTER — Other Ambulatory Visit (HOSPITAL_COMMUNITY): Payer: Self-pay

## 2024-05-25 ENCOUNTER — Other Ambulatory Visit: Payer: Self-pay

## 2024-05-25 NOTE — Progress Notes (Signed)
 Specialty Pharmacy Refill Coordination Note  Meagan Ramos is a 48 y.o. female contacted today regarding refills of specialty medication(s) Mepolizumab  (Nucala )   Patient requested Delivery   Delivery date: 06/04/24   Verified address: [address]   Medication will be filled on 06/03/24.

## 2024-05-26 ENCOUNTER — Other Ambulatory Visit (HOSPITAL_COMMUNITY): Payer: Self-pay

## 2024-05-26 ENCOUNTER — Other Ambulatory Visit: Payer: Self-pay

## 2024-06-03 ENCOUNTER — Other Ambulatory Visit: Payer: Self-pay

## 2024-06-16 ENCOUNTER — Other Ambulatory Visit: Payer: Self-pay | Admitting: Family Medicine

## 2024-06-16 DIAGNOSIS — Z1231 Encounter for screening mammogram for malignant neoplasm of breast: Secondary | ICD-10-CM

## 2024-06-22 ENCOUNTER — Other Ambulatory Visit (HOSPITAL_COMMUNITY): Payer: Self-pay

## 2024-06-22 ENCOUNTER — Ambulatory Visit
Admission: RE | Admit: 2024-06-22 | Discharge: 2024-06-22 | Disposition: A | Discharge status: Home / Self Care | Source: Ambulatory Visit | Attending: Family Medicine | Admitting: Family Medicine

## 2024-06-22 ENCOUNTER — Other Ambulatory Visit: Payer: Self-pay

## 2024-06-22 DIAGNOSIS — Z1231 Encounter for screening mammogram for malignant neoplasm of breast: Secondary | ICD-10-CM

## 2024-07-01 ENCOUNTER — Other Ambulatory Visit: Payer: Self-pay

## 2024-07-01 NOTE — Progress Notes (Signed)
 Specialty Pharmacy Refill Coordination Note  Meagan Ramos is a 48 y.o. female contacted today regarding refills of specialty medication(s) Mepolizumab  (Nucala )   Patient requested Delivery   Delivery date: 07/06/24   Verified address: [address]   [place]  [address]   Medication will be filled on 07/05/24.

## 2024-07-02 ENCOUNTER — Other Ambulatory Visit: Payer: Self-pay

## 2024-07-05 ENCOUNTER — Other Ambulatory Visit: Payer: Self-pay

## 2024-07-14 ENCOUNTER — Other Ambulatory Visit (HOSPITAL_COMMUNITY): Payer: Self-pay

## 2024-07-23 ENCOUNTER — Other Ambulatory Visit: Payer: Self-pay

## 2024-07-30 ENCOUNTER — Other Ambulatory Visit: Payer: Self-pay

## 2024-07-31 ENCOUNTER — Encounter (INDEPENDENT_AMBULATORY_CARE_PROVIDER_SITE_OTHER): Payer: Self-pay

## 2024-08-02 ENCOUNTER — Other Ambulatory Visit: Payer: Self-pay

## 2024-08-02 NOTE — Progress Notes (Signed)
 Specialty Pharmacy Refill Coordination Note  Meagan Ramos is a 48 y.o. female contacted today regarding refills of specialty medication(s) Mepolizumab  (Nucala )   Patient requested Delivery   Delivery date: 08/06/24   Verified address: [address] [place] [address]   Medication will be filled on 08/05/24.

## 2024-08-04 ENCOUNTER — Other Ambulatory Visit: Payer: Self-pay

## 2024-08-05 ENCOUNTER — Other Ambulatory Visit (HOSPITAL_COMMUNITY): Payer: Self-pay

## 2024-08-06 ENCOUNTER — Other Ambulatory Visit: Payer: Self-pay

## 2024-08-06 ENCOUNTER — Other Ambulatory Visit (HOSPITAL_COMMUNITY): Payer: Self-pay

## 2024-08-06 MED ORDER — WEGOVY 1.7 MG/0.75ML ~~LOC~~ SOAJ
1.7000 mg | SUBCUTANEOUS | 2 refills | Status: AC
  Filled 2024-08-06: qty 3, 28d supply, fill #0
  Filled 2024-08-30 (×2): qty 3, 28d supply, fill #1

## 2024-08-09 ENCOUNTER — Other Ambulatory Visit (HOSPITAL_COMMUNITY): Payer: Self-pay

## 2024-08-27 ENCOUNTER — Other Ambulatory Visit: Payer: Self-pay

## 2024-08-27 ENCOUNTER — Other Ambulatory Visit: Payer: Self-pay | Admitting: Pulmonary Disease

## 2024-08-27 ENCOUNTER — Encounter (INDEPENDENT_AMBULATORY_CARE_PROVIDER_SITE_OTHER): Payer: Self-pay

## 2024-08-27 DIAGNOSIS — J4550 Severe persistent asthma, uncomplicated: Secondary | ICD-10-CM

## 2024-08-27 MED ORDER — NUCALA 100 MG/ML ~~LOC~~ SOAJ
100.0000 mg | SUBCUTANEOUS | 0 refills | Status: DC
  Filled 2024-08-27 – 2024-08-30 (×2): qty 2, 56d supply, fill #0

## 2024-08-27 NOTE — Telephone Encounter (Signed)
 Refill sent for NUCALA  to [facility]: [phone]   Dose: 100mg  [place] every 28 days   Last OV: 01/14/24 Provider: [facility]  Next OV: overdue - due September 2025  Routing to scheduling team for follow-up on appt scheduling  Aleck Puls, PharmD, BCPS Clinical Pharmacist  [facility]

## 2024-08-27 NOTE — Telephone Encounter (Signed)
 Called patient, she said she would give us  a call back on Monday when she has a date she can come in.

## 2024-08-27 NOTE — Telephone Encounter (Signed)
 Pt requesting refill of specialty medication - routing to Rx team to advise.

## 2024-08-30 ENCOUNTER — Other Ambulatory Visit: Payer: Self-pay

## 2024-08-30 ENCOUNTER — Other Ambulatory Visit (HOSPITAL_COMMUNITY): Payer: Self-pay

## 2024-08-30 NOTE — Progress Notes (Signed)
 Specialty Pharmacy Refill Coordination Note  Meagan Ramos is a 48 y.o. female contacted today regarding refills of specialty medication(s) Mepolizumab  (Nucala )   Patient requested Delivery   Delivery date: 09/07/24   Verified address: [address] [place] [address]   Medication will be filled on 10.27.25.

## 2024-09-02 ENCOUNTER — Other Ambulatory Visit: Payer: Self-pay

## 2024-09-06 ENCOUNTER — Other Ambulatory Visit: Payer: Self-pay

## 2024-09-06 NOTE — Progress Notes (Signed)
 Specialty Pharmacy Ongoing Clinical Assessment Note  Meagan Ramos is a 48 y.o. female who is being followed by the specialty pharmacy service for RxSp Asthma/COPD   Patient's specialty medication(s) reviewed today: Mepolizumab  (Nucala )   Missed doses in the last 4 weeks: 0   Patient/Caregiver did not have any additional questions or concerns.   Therapeutic benefit summary: Patient is achieving benefit   Adverse events/side effects summary: No adverse events/side effects   Patient's therapy is appropriate to: Continue    Goals Addressed             This Visit's Progress    Minimize recurrence of flares   On track    Patient is on track. Patient will maintain adherence.  Patient reports that she is very well-controlled on therapy.          Follow up: 12 months  Silvano LOISE Dolly Specialty Pharmacist

## 2024-10-11 ENCOUNTER — Ambulatory Visit: Admitting: Pulmonary Disease

## 2024-10-11 ENCOUNTER — Encounter: Payer: Self-pay | Admitting: Pulmonary Disease

## 2024-10-11 VITALS — BP 112/82 | HR 87 | Temp 98.1°F | Ht 62.0 in | Wt 129.0 lb

## 2024-10-11 DIAGNOSIS — J45909 Unspecified asthma, uncomplicated: Secondary | ICD-10-CM | POA: Diagnosis not present

## 2024-10-11 DIAGNOSIS — F17210 Nicotine dependence, cigarettes, uncomplicated: Secondary | ICD-10-CM | POA: Diagnosis not present

## 2024-10-11 DIAGNOSIS — Z716 Tobacco abuse counseling: Secondary | ICD-10-CM

## 2024-10-11 DIAGNOSIS — J4550 Severe persistent asthma, uncomplicated: Secondary | ICD-10-CM

## 2024-10-11 MED ORDER — TRELEGY ELLIPTA 200-62.5-25 MCG/ACT IN AEPB: 1.0000 | INHALATION_SPRAY | Freq: Every day | RESPIRATORY_TRACT | Status: AC

## 2024-10-11 NOTE — Addendum Note (Signed)
 Addended by: ROLANDA POWELL SAILOR on: 10/11/2024 09:46 AM   Modules accepted: Orders

## 2024-10-11 NOTE — Progress Notes (Signed)
 @Patient  ID: Meagan Ramos, female    DOB: 10-Sep-1976, 48 y.o.   MRN: [id number]  Chief Complaint  Patient presents with   Asthma    More sob this am.  Ran out of Trelegy this am.  Set stop smoke date as Dec. 6th.    Referring provider: Corrington, Kip A, MD  HPI:   48 y.o. woman whom we are seeing in follow up for evaluation of DOE, cough felt to be related to poorly controlled asthma.  Multiple telephone encounters between clinical staff and pharmacist from pulmonary office in interim since last visit reviewed.    Overall doing well.  Well-maintained on Nucala  and Trelegy.  Minimal symptoms.  She has had ongoing weight loss, 70 pounds total, 20 pounds in the last visit.  Has lost access to her GLP-1.  But she has maintained success in weight loss with healthy eating, lifestyle modification.  Lost access to Trelegy over the holiday weekend.  Little more short of breath with that.  We discussed COVID vaccination.  HPI at initial visit: Patient reports shortness of breath for about 3 months.  Possibly just worsening over the last 3 months.  There is no dating back to at least 2022 for shortness of breath including ED visit.  That note was also reviewed.  At that time February 2022 chest x-ray on my read interpretation was clear.  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  Shortness of breath stable throughout the day.  Worsening with inclines or stairs.  No times a day with things are better or worse.  No seasonal environmental factors she can identify that make things better or worse.  No position make things better or worse.  Uses albuterol  sometimes when she cannot breathe well the inhaler does not work well.  However albuterol  nebulizer grandmother's house is quite effective.  Also prescribed Flovent recently.  She does not feel like this helps at all.  No other alleviating or  exacerbating factors.   PMH: Tobacco abuse Surgical history: Cholecystectomy, mandible surgery, tubal ligation Family history: Mother with thyroid cancer, hypertension, father with lung cancer Social history: 30-pack-year smoking history, down to half a pack a day more recently with worsening shortness of breath, works as Secondary School Teacher / Pulmonary Flowsheets:   ACT:  Asthma Control Test ACT Total Score  10/11/2024  9:01 AM 22    MMRC:     No data to display          Epworth:      No data to display          Tests:   FENO:  No results found for: NITRICOXIDE  PFT:    Latest Ref Rng & Units 07/22/2022   10:53 AM  PFT Results  FVC-Pre L 3.20   FVC-Predicted Pre % 87   FVC-Post L 3.37   FVC-Predicted Post % 92   Pre FEV1/FVC % % 81   Post FEV1/FCV % % 81   FEV1-Pre L 2.60   FEV1-Predicted Pre % 87   FEV1-Post L 2.73   DLCO uncorrected ml/min/mmHg 23.70   DLCO UNC% % 109   DLCO corrected ml/min/mmHg 23.70   DLCO COR %Predicted % 109   DLVA Predicted % 105   TLC L 6.15   TLC % Predicted % 121   RV % Predicted % 176  Personally reviewed and interpreted as normal spirometry without significant bronchodilator response.  Lung volumes indicate hyperinflation and air trapping.  DLCO within normal limits.  WALK:      No data to display          Imaging: Personally reviewed and as per EMR discussion this note No results found.  Lab Results: Personally reviewed CBC    Component Value Date/Time   WBC 10.0 10/30/2023 1948   RBC 4.64 10/30/2023 1948   HGB 13.2 10/30/2023 1948   HCT 40.8 10/30/2023 1948   PLT 270 10/30/2023 1948   MCV 87.9 10/30/2023 1948   MCH 28.4 10/30/2023 1948   MCHC 32.4 10/30/2023 1948   RDW 13.6 10/30/2023 1948   LYMPHSABS 3.3 11/19/2022 1502   MONOABS 0.7 11/19/2022 1502   EOSABS 0.3 11/19/2022 1502   BASOSABS 0.1 11/19/2022 1502    BMET    Component Value Date/Time   NA 137 10/30/2023  1948   K 3.7 10/30/2023 1948   CL 104 10/30/2023 1948   CO2 23 10/30/2023 1948   GLUCOSE 84 10/30/2023 1948   BUN 7 10/30/2023 1948   CREATININE 0.71 10/30/2023 1948   CREATININE 0.52 12/16/2014 1034   CALCIUM 9.4 10/30/2023 1948   GFRNONAA >60 10/30/2023 1948   GFRNONAA >89 12/16/2014 1034   GFRAA >60 06/05/2020 0435   GFRAA >89 12/16/2014 1034    BNP    Component Value Date/Time   BNP 49.4 12/26/2020 2153    ProBNP No results found for: PROBNP  Specialty Problems       Pulmonary Problems   Chronic cough   Cough   Severe persistent asthma without complication (HCC)    Allergies  Allergen Reactions   Penicillins Rash and Other (See Comments)    Childhood allergy  Other Reaction(s): Not available    Immunization History  Administered Date(s) Administered   Hepatitis B, ADULT 01/03/2005   Influenza Inj Mdck Quad Pf 09/27/2021, 11/14/2022   Influenza,inj,Quad PF,6+ Mos 12/16/2014, 09/15/2018   Moderna Sars-Covid-2 Vaccination 04/18/2020, 05/16/2020   Pneumococcal Polysaccharide-23 12/16/2014   Tdap 07/01/2015, 12/21/2018    History reviewed. No pertinent past medical history.  Tobacco History: Social History   Tobacco Use  Smoking Status Every Day   Current packs/day: 0.50   Average packs/day: 0.5 packs/day for 23.0 years (11.5 ttl pk-yrs)   Types: Cigarettes  Smokeless Tobacco Never  Tobacco Comments   Smoking 6-7 per day.  Has set stop date for 12/6.  Hfb RN   Ready to quit: Not Answered Counseling given: Not Answered Tobacco comments: Smoking 6-7 per day.  Has set stop date for 12/6.  Hfb RN   Continue to not smoke  Outpatient Encounter Medications as of 10/11/2024  Medication Sig   albuterol  (PROVENTIL ) (2.5 MG/3ML) 0.083% nebulizer solution Take 3 mLs (2.5 mg total) by nebulization every 4 (four) hours as needed for wheezing or shortness of breath.   albuterol  (VENTOLIN  HFA) 108 (90 Base) MCG/ACT inhaler Inhale 2 puffs into the lungs every  6 (six) hours as needed for wheezing or shortness of breath.   benzonatate  (TESSALON ) 100 MG capsule Take 1 capsule (100 mg total) by mouth every 8 (eight) hours as needed for cough.   busPIRone (BUSPAR) 10 MG tablet Take 10 mg by mouth at bedtime.   clonazePAM  (KLONOPIN ) 1 MG tablet Take 1 mg by mouth 3 (three) times daily as needed for anxiety.   clonazePAM  (KLONOPIN ) 1 MG tablet Take 1 mg by mouth 2 (two)  times daily.   clonazePAM  (KLONOPIN ) 1 MG tablet Take 1 mg by mouth 2 (two) times daily.   Exenatide ER (BYDUREON BCISE) 2 MG/0.85ML AUIJ    fluconazole  (DIFLUCAN ) 100 MG tablet Take 100 mg by mouth as directed.   fluticasone  (FLOVENT HFA) 110 MCG/ACT inhaler Inhale 1 puff into the lungs 2 (two) times daily.   Fluticasone -Umeclidin-Vilant (TRELEGY ELLIPTA ) 200-62.5-25 MCG/ACT AEPB Inhale 1 puff into the lungs daily.   HYDROcodone-acetaminophen  (NORCO) 10-325 MG tablet Take 1-2 tablets by mouth every 6 (six) hours as needed.   Mepolizumab  (NUCALA ) 100 MG/ML SOAJ Inject 1 mL (100 mg total) into the skin every 28 (twenty-eight) days. ** Please schedule appointment for further refills. **   methocarbamol  (ROBAXIN ) 500 MG tablet Take 500 mg by mouth every 8 (eight) hours as needed.   naloxone (NARCAN) nasal spray 4 mg/0.1 mL Place 1 spray into the nose once.   nitrofurantoin, macrocrystal-monohydrate, (MACROBID) 100 MG capsule Take 1 capsule by mouth 2 (two) times daily.   nystatin  (MYCOSTATIN ) 100000 UNIT/ML suspension Take 5 mLs (500,000 Units total) by mouth 4 (four) times daily.   ondansetron  (ZOFRAN ) 8 MG tablet Take 1 tablet (8 mg total) by mouth every 8 (eight) hours as needed for nausea for up to 7 days   oxyCODONE  (OXY IR/ROXICODONE ) 5 MG immediate release tablet Take 5 mg by mouth every 6 (six) hours as needed for severe pain (pain score 7-10) or breakthrough pain.   promethazine  (PHENERGAN ) 25 MG tablet Take 25 mg by mouth every 6 (six) hours as needed.   RESTASIS 0.05 % ophthalmic  emulsion Place 1 drop into both eyes 2 (two) times daily.   semaglutide -weight management (WEGOVY ) 1.7 MG/0.75ML SOAJ SQ injection Inject 1.7 mg into the skin once a week.   traMADol (ULTRAM) 50 MG tablet Take 50 mg by mouth every 6 (six) hours as needed.   Fluticasone -Umeclidin-Vilant (TRELEGY ELLIPTA ) 200-62.5-25 MCG/ACT AEPB Inhale 1 puff into the lungs daily.   No facility-administered encounter medications on file as of 10/11/2024.     Review of Systems  Review of Systems  N/a  Physical Exam  BP 112/82 (BP Location: Left Arm, Patient Position: Sitting)   Pulse 87   Temp 98.1 F (36.7 C) (Oral)   Ht 5' 2 (1.575 m)   Wt 129 lb (58.5 kg)   SpO2 92% Comment: RA  BMI 23.59 kg/m   Wt Readings from Last 5 Encounters:  10/11/24 129 lb (58.5 kg)  01/14/24 153 lb (69.4 kg)  09/03/23 172 lb (78 kg)  08/12/23 176 lb 12.8 oz (80.2 kg)  07/27/23 179 lb (81.2 kg)    BMI Readings from Last 5 Encounters:  10/11/24 23.59 kg/m  01/14/24 27.98 kg/m  09/03/23 30.96 kg/m  08/12/23 31.82 kg/m  07/27/23 32.22 kg/m     Physical Exam General: Sitting in chair, well-appearing Eyes: No icterus Neck: No JVP Pulmonary: Normal work of breathing, no wheeze, clear bilaterally Cardiovascular: Warm Abdomen: Nondistended  Assessment & Plan:   Dyspnea on exertion and cough largely related to asthma: PFTs with air trapping.  Marked improvement with triple inhaled therapy and Trelegy.  Ongoing symptoms frequent exacerbations prompting Nucala  start 11/2022.  Minimal symptoms since, continue Trelegy and Nucala .  Tobacco abuse: Smoking assessment and cessation counseling. Patient currently smoking. I have advised the patient to quit/stop smoking as soon as possible due to high risk for multiple medical problems.  It will also be very difficult for us  to manage patient's  respiratory symptoms  and status if we continue to expose her lungs to a known irritant.  Patient is willing to quit smoking.  I have advised the patient that we can assist and have options of nicotine replacement therapy, provided smoking cessation education today, provided smoking cessation counseling, and provided cessation resources.  Counseled to continue strategy of gradual reduction in cigarettes.  Quit date scheduled 10/16/2024.  Encouraged to follow through with this. Follow-up next office visit office visit for assessment of smoking cessation.  I spent 3 minutes in smoking cessation counseling.   Return in about 6 months (around 04/11/2025) for f/u Dr. Annella.   Donnice JONELLE Annella, MD 10/11/2024

## 2024-10-25 ENCOUNTER — Other Ambulatory Visit: Payer: Self-pay | Admitting: Pulmonary Disease

## 2024-10-25 ENCOUNTER — Other Ambulatory Visit: Payer: Self-pay

## 2024-10-25 DIAGNOSIS — J4550 Severe persistent asthma, uncomplicated: Secondary | ICD-10-CM

## 2024-10-25 MED ORDER — NUCALA 100 MG/ML ~~LOC~~ SOAJ
100.0000 mg | SUBCUTANEOUS | 4 refills | Status: AC
  Filled 2024-10-25: qty 1, 28d supply, fill #0
  Filled 2024-11-29: qty 1, 28d supply, fill #1

## 2024-10-25 NOTE — Telephone Encounter (Signed)
 Refill sent for NUCALA  to [facility]: [phone]   Dose: 100mg  [place] every 28 days   Last OV: 10/11/24 Provider: Dr. Annella   Next OV: due June 2026  Meagan Ramos, PharmD, BCPS Clinical Pharmacist  [facility]

## 2024-10-27 ENCOUNTER — Other Ambulatory Visit: Payer: Self-pay

## 2024-10-27 NOTE — Progress Notes (Signed)
 Specialty Pharmacy Refill Coordination Note  Meagan Ramos is a 48 y.o. female contacted today regarding refills of specialty medication(s) Mepolizumab  (Nucala )   Patient requested Delivery   Delivery date: 11/09/24   Verified address: [address] [place] [address]   Medication will be filled on: 11/08/24

## 2024-11-08 ENCOUNTER — Other Ambulatory Visit: Payer: Self-pay

## 2024-11-29 ENCOUNTER — Other Ambulatory Visit (HOSPITAL_COMMUNITY): Payer: Self-pay

## 2024-12-01 ENCOUNTER — Other Ambulatory Visit: Payer: Self-pay | Admitting: Pharmacy Technician

## 2024-12-01 ENCOUNTER — Other Ambulatory Visit: Payer: Self-pay

## 2024-12-01 NOTE — Progress Notes (Signed)
 Specialty Pharmacy Refill Coordination Note  Meagan Ramos is a 49 y.o. female contacted today regarding refills of specialty medication(s) Mepolizumab  (Nucala )   Patient requested Delivery   Delivery date: 12/09/24   Verified address: [address]  [place]   Medication will be filled on: 12/08/24

## 2024-12-06 ENCOUNTER — Other Ambulatory Visit (HOSPITAL_COMMUNITY): Payer: Self-pay

## 2024-12-06 MED ORDER — WEGOVY 1.7 MG/0.75ML ~~LOC~~ SOAJ
1.7000 mg | SUBCUTANEOUS | 2 refills | Status: AC
  Filled 2024-12-06: qty 3, 28d supply, fill #0

## 2024-12-07 ENCOUNTER — Other Ambulatory Visit (HOSPITAL_COMMUNITY): Payer: Self-pay

## 2024-12-08 ENCOUNTER — Other Ambulatory Visit: Payer: Self-pay
